# Patient Record
Sex: Male | Born: 1975 | Hispanic: Yes | Smoking: Never smoker
Health system: Southern US, Community
[De-identification: ages and names within clinical notes are randomized; demographics above are authoritative.]

---

## 2019-12-29 ENCOUNTER — Ambulatory Visit (INDEPENDENT_AMBULATORY_CARE_PROVIDER_SITE_OTHER): Payer: Self-pay

## 2019-12-29 ENCOUNTER — Other Ambulatory Visit: Payer: Self-pay

## 2019-12-29 ENCOUNTER — Ambulatory Visit
Admission: EM | Admit: 2019-12-29 | Discharge: 2019-12-29 | Disposition: A | Discharge status: Home / Self Care | Payer: Self-pay | Attending: Physician Assistant | Admitting: Physician Assistant

## 2019-12-29 DIAGNOSIS — M79644 Pain in right finger(s): Secondary | ICD-10-CM

## 2019-12-29 DIAGNOSIS — S61220A Laceration with foreign body of right index finger without damage to nail, initial encounter: Secondary | ICD-10-CM

## 2019-12-29 DIAGNOSIS — M7989 Other specified soft tissue disorders: Secondary | ICD-10-CM

## 2019-12-29 DIAGNOSIS — Z23 Encounter for immunization: Secondary | ICD-10-CM

## 2019-12-29 DIAGNOSIS — S61200A Unspecified open wound of right index finger without damage to nail, initial encounter: Secondary | ICD-10-CM

## 2019-12-29 MED ORDER — CEPHALEXIN 500 MG PO CAPS: 500.0000 mg | ORAL_CAPSULE | Freq: Four times a day (QID) | ORAL | 0 refills | Status: AC

## 2019-12-29 MED ORDER — TETANUS-DIPHTH-ACELL PERTUSSIS 5-2.5-18.5 LF-MCG/0.5 IM SUSP
0.5000 mL | Freq: Once | INTRAMUSCULAR | Status: AC
  Administered 2019-12-29: 0.5 mL via INTRAMUSCULAR

## 2019-12-29 MED ORDER — DICLOFENAC SODIUM 50 MG PO TBEC: 50.0000 mg | DELAYED_RELEASE_TABLET | Freq: Two times a day (BID) | ORAL | 0 refills | Status: AC

## 2019-12-29 NOTE — ED Triage Notes (Signed)
Pt sustained a laceration of the right pointer finger in multiple places on Friday of last week. Pt states it is not getting better and he was advised by one of the supervisors to be seen. Pt is aox4 and ambulatory.

## 2019-12-29 NOTE — Discharge Instructions (Addendum)
Xray negative for fracture. Tetanus updated. Start keflex for infection. Follow up with specialist for further management.

## 2019-12-29 NOTE — ED Provider Notes (Signed)
[facility]    CSN: [id number] Arrival date & time: 12/29/19  1623      History   Chief Complaint Chief Complaint  Patient presents with  . Laceration    occurred thursday    HPI Willie Cox is a 44 y.o. male.   44 year old male comes in for right 2nd finger injury that occurred 5-6 days ago. HPI obtained by patient through video translator. Patient was at work, using machine to cut wood, and somehow got hit by the disc that cuts the wood, causing multiple lacerations. Cleaned wound and apply a powder medicine on top of wound.  States dressed the wound daily. Unknown last tetanus.      History reviewed. No pertinent past medical history.  There are no problems to display for this patient.   History reviewed. No pertinent surgical history.     Home Medications    Prior to Admission medications   Medication Sig Start Date End Date Taking? Authorizing Provider  cephALEXin (KEFLEX) 500 MG capsule Take 1 capsule (500 mg total) by mouth 4 (four) times daily. 12/29/19   Cathie Hoops, Betheny Suchecki V, PA-C  diclofenac (VOLTAREN) 50 MG EC tablet Take 1 tablet (50 mg total) by mouth 2 (two) times daily. 12/29/19   Belinda Fisher, PA-C    Family History History reviewed. No pertinent family history.  Social History Social History   Tobacco Use  . Smoking status: Never Smoker  . Smokeless tobacco: Never Used  Vaping Use  . Vaping Use: Never used  Substance Use Topics  . Alcohol use: Never  . Drug use: Never     Allergies   Patient has no known allergies.   Review of Systems Review of Systems  Reason unable to perform ROS: See HPI as above.     Physical Exam Triage Vital Signs ED Triage Vitals  Enc Vitals Group     BP 12/29/19 1820 (!) 158/100     Pulse Rate 12/29/19 1820 65     Resp 12/29/19 1820 18     Temp 12/29/19 1820 97.6 F (36.4 C)     Temp Source 12/29/19 1820 Oral     SpO2 12/29/19 1820 97 %     Weight --      Height --      Head Circumference --       Peak Flow --      Pain Score 12/29/19 1838 6     Pain Loc --      Pain Edu? --      Excl. in GC? --    No data found.  Updated Vital Signs BP (!) 158/100 (BP Location: Left Arm)   Pulse 65   Temp 97.6 F (36.4 C) (Oral)   Resp 18   SpO2 97%   Physical Exam Constitutional:      General: He is not in acute distress.    Appearance: Normal appearance. He is well-developed. He is not toxic-appearing or diaphoretic.  HENT:     Head: Normocephalic and atraumatic.  Eyes:     Conjunctiva/sclera: Conjunctivae normal.     Pupils: Pupils are equal, round, and reactive to light.  Pulmonary:     Effort: Pulmonary effort is normal. No respiratory distress.  Musculoskeletal:     Cervical back: Normal range of motion and neck supple.     Comments: See picture below. Crusting of powder substance applied by patient. Wound not clearly visualized due to coating, but involves nailbed. Diffuse tenderness  to palpation. States no ROM and declined attempting. NVI  Skin:    General: Skin is warm and dry.  Neurological:     Mental Status: He is alert and oriented to person, place, and time.        UC Treatments / Results  Labs (all labs ordered are listed, but only abnormal results are displayed) Labs Reviewed - No data to display  EKG   Radiology DG Finger Index Right  Result Date: 12/29/2019 CLINICAL DATA:  Finger laceration with splinter would 6 days ago. Continued pain and swelling. EXAM: RIGHT INDEX FINGER 2+V COMPARISON:  None. FINDINGS: The mineralization and alignment are normal. There is no evidence of acute fracture or dislocation. Mild interphalangeal degenerative changes. The soft tissues appear swollen. No obvious foreign body or soft tissue emphysema. No evidence of bone destruction. IMPRESSION: Soft tissue swelling without evidence of foreign body or acute osseous findings. Electronically Signed   By: Carey Bullocks M.D.   On: 12/29/2019 19:25    Procedures Nerve  Block  Date/Time: 12/29/2019 8:55 PM Performed by: Belinda Fisher, PA-C Authorized by: Belinda Fisher, PA-C   Consent:    Consent obtained:  Verbal   Consent given by:  Patient   Risks discussed:  Allergic reaction, infection, intravenous injection, nerve damage, pain, swelling, unsuccessful block and bleeding   Alternatives discussed:  Alternative treatment Indications:    Indications:  Procedural anesthesia Location:    Body area:  Upper extremity   Upper extremity nerve blocked: finger.   Laterality:  Right Pre-procedure details:    Skin preparation:  Alcohol Procedure details (see MAR for exact dosages):    Block needle gauge:  18 G   Anesthetic injected:  Lidocaine 1% w/o epi   Injection procedure:  Anatomic landmarks identified, incremental injection, negative aspiration for blood, anatomic landmarks palpated and introduced needle Post-procedure details:    Outcome:  Pain improved   Patient tolerance of procedure:  Tolerated well, no immediate complications   (including critical care time)  Medications Ordered in UC Medications  Tdap (BOOSTRIX) injection 0.5 mL (0.5 mLs Intramuscular Given 12/29/19 2033)    Initial Impression / Assessment and Plan / UC Course  I have reviewed the triage vital signs and the nursing notes.  Pertinent labs & imaging results that were available during my care of the patient were reviewed by me and considered in my medical decision making (see chart for details).    X-ray negative for fracture or dislocation.  Wound was soaked after digital block, attempted removal of coating on wound without success.  Given almost 5-day history of symptoms, will place patient on antibiotics and follow-up with hand Ortho for further evaluation.  Tetanus updated.  Wound care instructions given.  Return precautions given.    Final Clinical Impressions(s) / UC Diagnoses   Final diagnoses:  Open wound of right index finger  Finger pain, right    ED Prescriptions      Medication Sig Dispense Auth. Provider   diclofenac (VOLTAREN) 50 MG EC tablet Take 1 tablet (50 mg total) by mouth 2 (two) times daily. 20 tablet Noni Stonesifer V, PA-C   cephALEXin (KEFLEX) 500 MG capsule Take 1 capsule (500 mg total) by mouth 4 (four) times daily. 28 capsule Belinda Fisher, PA-C     PDMP not reviewed this encounter.   Belinda Fisher, PA-C 12/29/19 2057

## 2020-08-11 ENCOUNTER — Other Ambulatory Visit: Payer: Self-pay

## 2020-08-11 ENCOUNTER — Emergency Department (HOSPITAL_COMMUNITY): Payer: Self-pay

## 2020-08-11 ENCOUNTER — Emergency Department (HOSPITAL_COMMUNITY)
Admission: EM | Admit: 2020-08-11 | Discharge: 2020-08-11 | Disposition: A | Discharge status: Home / Self Care | Payer: Self-pay | Attending: Emergency Medicine | Admitting: Emergency Medicine

## 2020-08-11 ENCOUNTER — Encounter (HOSPITAL_COMMUNITY): Payer: Self-pay | Admitting: Pharmacy Technician

## 2020-08-11 DIAGNOSIS — M25551 Pain in right hip: Secondary | ICD-10-CM | POA: Insufficient documentation

## 2020-08-11 DIAGNOSIS — M25561 Pain in right knee: Secondary | ICD-10-CM | POA: Insufficient documentation

## 2020-08-11 DIAGNOSIS — R519 Headache, unspecified: Secondary | ICD-10-CM | POA: Insufficient documentation

## 2020-08-11 DIAGNOSIS — M62830 Muscle spasm of back: Secondary | ICD-10-CM | POA: Insufficient documentation

## 2020-08-11 DIAGNOSIS — W11XXXA Fall on and from ladder, initial encounter: Secondary | ICD-10-CM | POA: Insufficient documentation

## 2020-08-11 DIAGNOSIS — M79651 Pain in right thigh: Secondary | ICD-10-CM | POA: Insufficient documentation

## 2020-08-11 DIAGNOSIS — W19XXXA Unspecified fall, initial encounter: Secondary | ICD-10-CM

## 2020-08-11 DIAGNOSIS — M542 Cervicalgia: Secondary | ICD-10-CM | POA: Insufficient documentation

## 2020-08-11 DIAGNOSIS — R109 Unspecified abdominal pain: Secondary | ICD-10-CM | POA: Insufficient documentation

## 2020-08-11 LAB — CBC WITH DIFFERENTIAL/PLATELET
Abs Immature Granulocytes: 0.03 10*3/uL (ref 0.00–0.07)
Basophils Absolute: 0.1 10*3/uL (ref 0.0–0.1)
Basophils Relative: 1 %
Eosinophils Absolute: 1.3 10*3/uL — ABNORMAL HIGH (ref 0.0–0.5)
Eosinophils Relative: 12 %
HCT: 41.6 % (ref 39.0–52.0)
Hemoglobin: 14.3 g/dL (ref 13.0–17.0)
Immature Granulocytes: 0 %
Lymphocytes Relative: 24 %
Lymphs Abs: 2.7 10*3/uL (ref 0.7–4.0)
MCH: 30.4 pg (ref 26.0–34.0)
MCHC: 34.4 g/dL (ref 30.0–36.0)
MCV: 88.5 fL (ref 80.0–100.0)
Monocytes Absolute: 0.8 10*3/uL (ref 0.1–1.0)
Monocytes Relative: 7 %
Neutro Abs: 6.3 10*3/uL (ref 1.7–7.7)
Neutrophils Relative %: 56 %
Platelets: 270 10*3/uL (ref 150–400)
RBC: 4.7 MIL/uL (ref 4.22–5.81)
RDW: 13 % (ref 11.5–15.5)
WBC: 11.2 10*3/uL — ABNORMAL HIGH (ref 4.0–10.5)
nRBC: 0 % (ref 0.0–0.2)

## 2020-08-11 LAB — COMPREHENSIVE METABOLIC PANEL
ALT: 38 U/L (ref 0–44)
AST: 46 U/L — ABNORMAL HIGH (ref 15–41)
Albumin: 3.7 g/dL (ref 3.5–5.0)
Alkaline Phosphatase: 70 U/L (ref 38–126)
Anion gap: 5 (ref 5–15)
BUN: 12 mg/dL (ref 6–20)
CO2: 25 mmol/L (ref 22–32)
Calcium: 8.6 mg/dL — ABNORMAL LOW (ref 8.9–10.3)
Chloride: 108 mmol/L (ref 98–111)
Creatinine, Ser: 0.55 mg/dL — ABNORMAL LOW (ref 0.61–1.24)
GFR, Estimated: >60 mL/min (ref >60)
Glucose, Bld: 96 mg/dL (ref 70–99)
Potassium: 3.7 mmol/L (ref 3.5–5.1)
Sodium: 138 mmol/L (ref 135–145)
Total Bilirubin: 1.3 mg/dL — ABNORMAL HIGH (ref 0.3–1.2)
Total Protein: 6.7 g/dL (ref 6.5–8.1)

## 2020-08-11 MED ORDER — IOHEXOL 300 MG/ML  SOLN
75.0000 mL | Freq: Once | INTRAMUSCULAR | Status: AC | PRN
  Administered 2020-08-11: 75 mL via INTRAVENOUS

## 2020-08-11 MED ORDER — OXYCODONE-ACETAMINOPHEN 5-325 MG PO TABS: 1.0000 | ORAL_TABLET | Freq: Four times a day (QID) | ORAL | 0 refills | Status: AC | PRN

## 2020-08-11 MED ORDER — OXYCODONE-ACETAMINOPHEN 5-325 MG PO TABS
2.0000 | ORAL_TABLET | Freq: Once | ORAL | Status: AC
Start: 2020-08-11 — End: 2020-08-11
  Administered 2020-08-11: 2 via ORAL
  Filled 2020-08-11: qty 2

## 2020-08-11 MED ORDER — METHOCARBAMOL 750 MG PO TABS: 750.0000 mg | ORAL_TABLET | Freq: Four times a day (QID) | ORAL | 0 refills | Status: AC

## 2020-08-11 NOTE — ED Notes (Signed)
Per triage - "Pt here with reports of falling approx 30 feet off of a ladder yesterday, landing on his R side and abdomen. Pt endorses +LOC yesterday. Pt with pain to bil legs, abdomen, r temporal and R forearm. CCollar placed in triage."

## 2020-08-11 NOTE — ED Notes (Signed)
Patient transported to X-ray via transpo svc

## 2020-08-11 NOTE — ED Notes (Signed)
ED Provider at bedside. Dr. Freida Busman in with pt using the translating svc

## 2020-08-11 NOTE — ED Triage Notes (Signed)
Pt here with reports of falling approx 30 feet off of a ladder yesterday, landing on his R side and abdomen. Pt endorses +LOC yesterday. Pt with pain to bil legs, abdomen, r temporal and R forearm. CCollar placed in triage.

## 2020-08-11 NOTE — ED Notes (Signed)
Pt put in C collar

## 2020-08-11 NOTE — ED Provider Notes (Signed)
Emergency Medicine Provider Triage Evaluation Note  Willie Cox , a 45 y.o. male  was evaluated in triage.  Pt complains of fall from 3 foot ladder yesterday.  States that he was working on his house when he lost his balance.  States that he fell and hit his chest area on a balcony and then fell 15 feet more onto the ground.  States that he hit his head and did not lose consciousness.  Since then he has been having right-sided pelvis pain, right sided abdominal pain, right lower chest pain, right knee pain.  Denies any anticoagulant use.  Review of Systems  Positive: Abdominal pain, chest pain, loss of consciousness Negative: Vision changes, numbness  Physical Exam  BP (!) 140/97 (BP Location: Left Arm)   Pulse 70   Temp 98.2 F (36.8 C) (Oral)   Resp 16   SpO2 97%  Gen:   Awake, no distress Resp:  Normal MSK:   Moves extremities without difficulty Other:  Tenderness palpation of the right lower quadrant.  Tenderness palpation of bilateral chest wall.  No facial asymmetry.  Moving all extremities without difficulty.  Medical Decision Making  Medically screening exam initiated at 12:35 PM.  Appropriate orders placed.  Maurine Minister Arcidesvelasquel was informed that the remainder of the evaluation will be completed by another provider, this initial triage assessment does not replace that evaluation, and the importance of remaining in the ED until their evaluation is complete.  Will order imaging and lab work   Dietrich Pates, Cordelia Poche 08/11/20 1237    Margarita Grizzle, MD 08/14/20 1340

## 2020-08-11 NOTE — ED Provider Notes (Signed)
[facility] EMERGENCY DEPARTMENT Provider Note   CSN: [id number] Arrival date & time: 08/11/20  1152     History Chief Complaint  Patient presents with  . Fall    Willie Cox is a 45 y.o. male.  45 year old male presents after falling 30 feet from a ladder yesterday.  Denies any loss of consciousness.  Complains of pain to his right hip, right flank, right thigh.  Slight right knee pain as well 2.  He is able to ambulate.  Has had some midline neck pain characterizes sharp.  Denies any weakness in his arms or legs.  Denies any hematuria or dysuria denies any chest or abdominal discomfort at this time.  Has had a mild headache but no visual changes or nausea or emesis.  Has used Motrin prior to arrival with limited relief        History reviewed. No pertinent past medical history.  There are no problems to display for this patient.   History reviewed. No pertinent surgical history.     No family history on file.     Home Medications Prior to Admission medications   Not on File    Allergies    Patient has no known allergies.  Review of Systems   Review of Systems  All other systems reviewed and are negative.   Physical Exam Updated Vital Signs BP (!) 140/97 (BP Location: Left Arm)   Pulse 70   Temp 98.2 F (36.8 C) (Oral)   Resp 16   SpO2 97%   Physical Exam Vitals and nursing note reviewed.  Constitutional:      General: He is not in acute distress.    Appearance: Normal appearance. He is well-developed. He is not toxic-appearing.  HENT:     Head: Normocephalic and atraumatic.  Eyes:     General: Lids are normal.     Conjunctiva/sclera: Conjunctivae normal.     Pupils: Pupils are equal, round, and reactive to light.  Neck:     Thyroid: No thyroid mass.     Trachea: No tracheal deviation.  Cardiovascular:     Rate and Rhythm: Normal rate and regular rhythm.     Heart sounds: Normal heart sounds. No murmur heard. No  gallop.   Pulmonary:     Effort: Pulmonary effort is normal. No respiratory distress.     Breath sounds: Normal breath sounds. No stridor. No decreased breath sounds, wheezing, rhonchi or rales.  Abdominal:     General: Bowel sounds are normal. There is no distension.     Palpations: Abdomen is soft.     Tenderness: There is no abdominal tenderness. There is no rebound.  Musculoskeletal:        General: Normal range of motion.     Cervical back: Normal range of motion and neck supple. Bony tenderness present. Pain with movement present. Normal range of motion.     Lumbar back: Spasms and tenderness present. No bony tenderness.       Back:       Legs:     Comments: No shortening or rotation to patient's lower extremity.  Neurovascular tact at right foot.  Pelvis stable  Skin:    General: Skin is warm and dry.     Findings: No abrasion or rash.  Neurological:     General: No focal deficit present.     Mental Status: He is alert and oriented to person, place, and time.     GCS: GCS eye subscore is  4. GCS verbal subscore is 5. GCS motor subscore is 6.     Cranial Nerves: Cranial nerves are intact. No cranial nerve deficit.     Sensory: No sensory deficit.     Motor: Motor function is intact.  Psychiatric:        Speech: Speech normal.        Behavior: Behavior normal.     ED Results / Procedures / Treatments   Labs (all labs ordered are listed, but only abnormal results are displayed) Labs Reviewed  COMPREHENSIVE METABOLIC PANEL  CBC WITH DIFFERENTIAL/PLATELET    EKG None  Radiology No results found.  Procedures Procedures   Medications Ordered in ED Medications  oxyCODONE-acetaminophen (PERCOCET/ROXICET) 5-325 MG per tablet 2 tablet (has no administration in time range)    ED Course  I have reviewed the triage vital signs and the nursing notes.  Pertinent labs & imaging results that were available during my care of the patient were reviewed by me and considered  in my medical decision making (see chart for details).    MDM Rules/Calculators/A&P                          She medicated for pain with Percocet here.  X-rays are negative.  Will prescribe analgesics and discharge Final Clinical Impression(s) / ED Diagnoses Final diagnoses:  None    Rx / DC Orders ED Discharge Orders    None       Lorre Nick, MD 08/11/20 1525

## 2020-08-14 ENCOUNTER — Other Ambulatory Visit: Payer: Self-pay

## 2020-08-14 ENCOUNTER — Emergency Department (HOSPITAL_COMMUNITY)
Admission: EM | Admit: 2020-08-14 | Discharge: 2020-08-14 | Disposition: A | Discharge status: Home / Self Care | Payer: Self-pay | Attending: Emergency Medicine | Admitting: Emergency Medicine

## 2020-08-14 ENCOUNTER — Emergency Department (HOSPITAL_COMMUNITY): Payer: Self-pay

## 2020-08-14 ENCOUNTER — Encounter (HOSPITAL_COMMUNITY): Payer: Self-pay | Admitting: *Deleted

## 2020-08-14 DIAGNOSIS — W11XXXA Fall on and from ladder, initial encounter: Secondary | ICD-10-CM | POA: Insufficient documentation

## 2020-08-14 DIAGNOSIS — M79604 Pain in right leg: Secondary | ICD-10-CM | POA: Insufficient documentation

## 2020-08-14 DIAGNOSIS — M545 Low back pain, unspecified: Secondary | ICD-10-CM | POA: Insufficient documentation

## 2020-08-14 DIAGNOSIS — Y990 Civilian activity done for income or pay: Secondary | ICD-10-CM | POA: Insufficient documentation

## 2020-08-14 DIAGNOSIS — M25551 Pain in right hip: Secondary | ICD-10-CM | POA: Insufficient documentation

## 2020-08-14 MED ORDER — IBUPROFEN 600 MG PO TABS: 600.0000 mg | ORAL_TABLET | Freq: Four times a day (QID) | ORAL | 0 refills | Status: AC | PRN

## 2020-08-14 MED ORDER — IBUPROFEN 400 MG PO TABS
600.0000 mg | ORAL_TABLET | Freq: Once | ORAL | Status: AC
  Administered 2020-08-14: 600 mg via ORAL
  Filled 2020-08-14: qty 1

## 2020-08-14 MED ORDER — ACETAMINOPHEN 500 MG PO TABS: 500.0000 mg | ORAL_TABLET | Freq: Four times a day (QID) | ORAL | 0 refills | Status: AC | PRN

## 2020-08-14 MED ORDER — ACETAMINOPHEN 500 MG PO TABS
1000.0000 mg | ORAL_TABLET | Freq: Once | ORAL | Status: AC
  Administered 2020-08-14: 1000 mg via ORAL
  Filled 2020-08-14: qty 2

## 2020-08-14 NOTE — Social Work (Signed)
CSW added [facility] to AVS in both [place] and [place].

## 2020-08-14 NOTE — ED Triage Notes (Signed)
Interpretor used. Pt had a fall 3 days ago, was seen here following the fall. Reports now has right side lower back pain and no relief with meds given.

## 2020-08-14 NOTE — ED Provider Notes (Addendum)
[facility] EMERGENCY DEPARTMENT Provider Note   CSN: [id number] Arrival date & time: 08/14/20  1451     History Chief Complaint  Patient presents with  . Back Pain    Willie Cox is a 45 y.o. male presents to the ED for evaluation of continued pain on the outside part of his right thigh that radiates to his right buttocks and right low back.  Patient had a fall while at work 3 days ago.  He fell off of a 30 foot ladder.  He was seen in the emergency department and had CT scans.  He was told that everything was normal.  He was discharged with Flexeril and oxycodone which he has been taking.  States the pain in his neck, back, right arm and left knee have resolved but his pain in his right low back, right hip, right leg and buttock continues and is unrelieved by medicines.  Pain is worse with movement, palpation.  Denies any repeat injury or falls.  No extremity weakness, tingling, numbness, groin numbness or difficulty controlling his bladder or bowels.  No fever.  No urinary symptoms.  No abdominal pain.  Patient states his boss is asking him to return to work but he feels like he needs more time to rest.  He is asking for a letter for work.  HPI     History reviewed. No pertinent past medical history.  There are no problems to display for this patient.   History reviewed. No pertinent surgical history.     History reviewed. No pertinent family history.  Social History   Tobacco Use  . Smoking status: Never Smoker  . Smokeless tobacco: Never Used  Vaping Use  . Vaping Use: Never used  Substance Use Topics  . Alcohol use: Never  . Drug use: Never    Home Medications Prior to Admission medications   Medication Sig Start Date End Date Taking? Authorizing Provider  acetaminophen (TYLENOL) 500 MG tablet Take 1 tablet (500 mg total) by mouth every 6 (six) hours as needed. 08/14/20  Yes Liberty Handy, PA-C  ibuprofen (ADVIL) 600 MG  tablet Take 1 tablet (600 mg total) by mouth every 6 (six) hours as needed. 08/14/20  Yes Liberty Handy, PA-C  cephALEXin (KEFLEX) 500 MG capsule Take 1 capsule (500 mg total) by mouth 4 (four) times daily. 12/29/19   Cathie Hoops, Amy V, PA-C  diclofenac (VOLTAREN) 50 MG EC tablet Take 1 tablet (50 mg total) by mouth 2 (two) times daily. 12/29/19   Belinda Fisher, PA-C  methocarbamol (ROBAXIN-750) 750 MG tablet Take 1 tablet (750 mg total) by mouth 4 (four) times daily. 08/11/20   Lorre Nick, MD  oxyCODONE-acetaminophen (PERCOCET/ROXICET) 5-325 MG tablet Take 1-2 tablets by mouth every 6 (six) hours as needed for severe pain. 08/11/20   Lorre Nick, MD    Allergies    Patient has no known allergies.  Review of Systems   Review of Systems  Musculoskeletal: Positive for arthralgias, gait problem and myalgias.  All other systems reviewed and are negative.   Physical Exam Updated Vital Signs BP (!) 148/90   Pulse 66   Temp 98.1 F (36.7 C) (Oral)   Resp 17   SpO2 98%   Physical Exam Vitals and nursing note reviewed.  Constitutional:      General: He is not in acute distress.    Appearance: He is well-developed.     Comments: NAD.  HENT:  Head: Normocephalic and atraumatic.     Right Ear: External ear normal.     Left Ear: External ear normal.     Nose: Nose normal.  Eyes:     General: No scleral icterus.    Conjunctiva/sclera: Conjunctivae normal.  Cardiovascular:     Rate and Rhythm: Normal rate and regular rhythm.     Heart sounds: Normal heart sounds. No murmur heard.     Comments: 1+ Dp pulses bilaterally. No LE edema  Pulmonary:     Effort: Pulmonary effort is normal.     Breath sounds: Normal breath sounds. No wheezing.  Abdominal:     Palpations: Abdomen is soft.     Tenderness: There is no abdominal tenderness.     Comments: No suprapubic or CVA tenderness. No abdominal ecchymosis   Musculoskeletal:        General: No deformity. Normal range of motion.     Cervical  back: Normal range of motion and neck supple.     Comments: T-spine: no midline or paraspinal tenderness.   L-spine: mild diffuse right sided low lumbar/upper buttock tenderness. No instability of pelvis with A/L compression. No leg shortening or malrotation. Pain when sitting up on bed.   RLE: diffuse mild tenderness of right lateral thigh, right buttock and right greater trochanter. Full ROM of right hip with some pain. No leg shortening or malrotation. No crepitus with right leg movement. +SLR (right).   Skin:    General: Skin is warm and dry.     Capillary Refill: Capillary refill takes less than 2 seconds.  Neurological:     Mental Status: He is alert and oriented to person, place, and time.     Comments: Strength and sensation to light touch intact in LEs  Psychiatric:        Behavior: Behavior normal.        Thought Content: Thought content normal.        Judgment: Judgment normal.     ED Results / Procedures / Treatments   Labs (all labs ordered are listed, but only abnormal results are displayed) Labs Reviewed - No data to display  EKG None  Radiology CT Lumbar Spine Wo Contrast  Result Date: 08/14/2020 CLINICAL DATA:  Low back pain, trauma EXAM: CT LUMBAR SPINE WITHOUT CONTRAST TECHNIQUE: Multidetector CT imaging of the lumbar spine was performed without intravenous contrast administration. Multiplanar CT image reconstructions were also generated. COMPARISON:  None. FINDINGS: Segmentation: 5 lumbar type vertebrae. Alignment: Straightening of lumbar lordosis. Anteroposterior alignment is maintained. Vertebrae: Vertebral body heights are preserved apart from a Schmorl's node at the L1 superior endplate. No acute fracture. Small bone island of left iliac bone. Paraspinal and other soft tissues: Unremarkable. Disc levels: Intervertebral disc heights are maintained. No canal stenosis. Minimal left foraminal stenosis at L5-S1. IMPRESSION: No acute cervical spine fracture.  Electronically Signed   By: Guadlupe Spanish M.D.   On: 08/14/2020 16:21    Procedures Procedures   Medications Ordered in ED Medications  acetaminophen (TYLENOL) tablet 1,000 mg (1,000 mg Oral Given 08/14/20 1943)  ibuprofen (ADVIL) tablet 600 mg (600 mg Oral Given 08/14/20 1943)    ED Course  I have reviewed the triage vital signs and the nursing notes.  Pertinent labs & imaging results that were available during my care of the patient were reviewed by me and considered in my medical decision making (see chart for details).    MDM Rules/Calculators/A&P  45 year old male presents to the ED for ongoing right low back pain radiating to right thigh after fall off of a 30 foot ladder while at work 3 days ago.    EMR, triage nurse notes reviewed.  ED visit 3 days ago reviewed.  He had extensive CT imaging which was surprisingly negative for acute injuries.  He was discharged with oxycodone/acetaminophen and Robaxin.  Patient states he has been taking "Robaxin" for his pain.  He did not know that oxycodone was pain medicine.  He is requesting a work note so he can have a few more days of rest.  I think this is very reasonable.    2045: CT of lumbar spine was ordered by [facility] provider.  CT personally reviewed.  Per radiology report on CT lumbar impression states "cervical spine", likely typo.  I have reviewed images myself. Noted abnormality at L4 axial images series #4 image 90.  Attempted to call [place] radiology x 2 but unable to get a hold of radiologist.   2315: Finally able to speak to [facility] radiologist.  Spoke to Dr Rise Mu who has reviewed images and confirms acute oblique fracture at L4 with questionable edema.  Would be best for patient to get MRI to confirm/further evaluate.  I called patient and explained situation. Given that patient is uninsured, has no PCP to follow up, feel it will be best for him to return to ER for further imaging.  Patient states he  doesn't have a ride to come back to the ED tonight. He will try to get a ride in the morning.    *If patient returns, provider to order MRI lumbar without* for further evaluation*.  Discussed with EDP Messick.   Final Clinical Impression(s) / ED Diagnoses Final diagnoses:  Right leg pain  Work related injury    Rx / DC Orders ED Discharge Orders         Ordered    ibuprofen (ADVIL) 600 MG tablet  Every 6 hours PRN        08/14/20 2018    acetaminophen (TYLENOL) 500 MG tablet  Every 6 hours PRN        08/14/20 2018             Liberty Handy, PA-C 08/14/20 2045    Liberty Handy, PA-C 08/14/20 2317    Liberty Handy, PA-C 08/14/20 2344    Wynetta Fines, MD 08/16/20 1401

## 2020-08-14 NOTE — ED Provider Notes (Signed)
Emergency Medicine Provider Triage Evaluation Note  Willie Cox , a 45 y.o. male  was evaluated in triage.  Pt complains of R lower back pain radiating down the leg right leg.  Seen 2 days ago.  Occurred after he fell off a ladder.  Medications not working.  Return for reevaluation, no weakness numbness or red flag symptoms  Review of Systems  Positive: Back and leg pain Negative: Weakness  Physical Exam  There were no vitals taken for this visit. Gen:   Awake, no distress   Resp:  Normal effort  MSK:   Moves extremities without difficulty  Other:  ambulates  Medical Decision Making  Medically screening exam initiated at 3:24 PM.  Appropriate orders placed.  Willie Cox was informed that the remainder of the evaluation will be completed by another provider, this initial triage assessment does not replace that evaluation, and the importance of remaining in the ED until their evaluation is complete.  CT lumbar ordered.   Arthor Captain, PA-C 08/14/20 1546    Arby Barrette, MD 08/15/20 1029

## 2020-08-14 NOTE — Discharge Instructions (Addendum)
[facility]  [phone]  [address] [place] [address] [place] [address]   Prximos pasos: Programe una cita lo antes posible para una visita  Tome  oxycodone/acetaminophen cada 6 hours (1 o 2 pastillas) para dolor.  Cuando termine estas pastillas tome 600 mg ibuprofen y [phone] mg acetaminophen cada 6 horas.   Methocarbamol (flexeril) 750 mg es una pastilla para [employer]. Tome 1 pastilla solo en la noche, antes de dormir.   Leanna Sato cita con un doctor general en la clinica de [facility]   Regrese si tiene fiebre, el dolor empeora o siente hormigueo, entumecimiento, problemas al controlar su Willie Cox

## 2022-10-04 IMAGING — CT CT HEAD W/O CM
4 series · 16 of 47 positions shown, 18 images · non-contrast
Comparison: None

CLINICAL DATA: Fell 30 feet off a ladder yesterday landing on RIGHT
side, loss of consciousness, RIGHT temporal pain, neck trauma,
midline tenderness

EXAM:
CT HEAD WITHOUT CONTRAST
CT CERVICAL SPINE WITHOUT CONTRAST
TECHNIQUE: Multidetector CT imaging of the head and cervical spine was
performed following the standard protocol without intravenous
contrast. Multiplanar CT image reconstructions of the cervical spine
were also generated.

[Series 3: head without · axial · non-contrast · 0.44mm/px · z∈[-118,+2]mm · 7 of 34 slices shown, 9 images]
[im 5/34  brain]
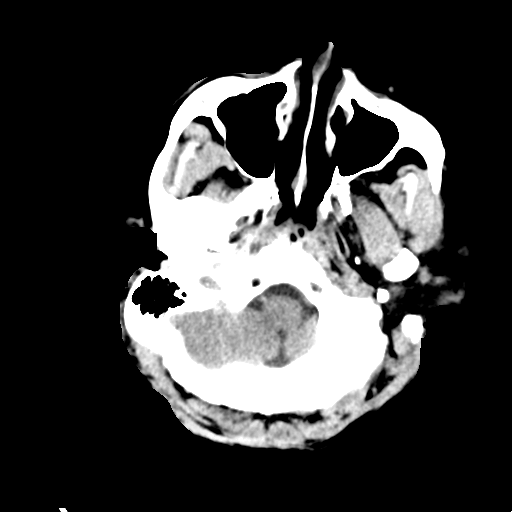
[im 5/34  bone]
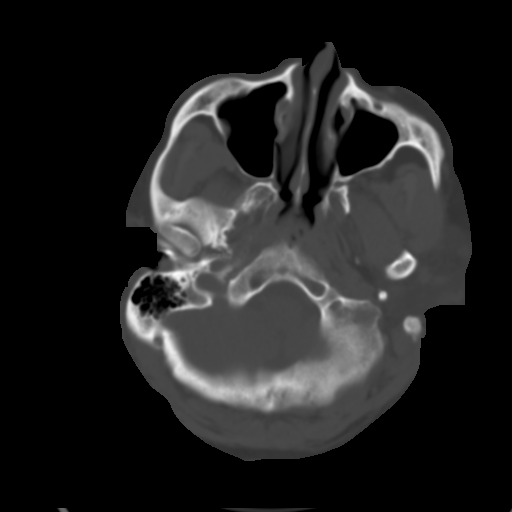
[im 9/34  brain]
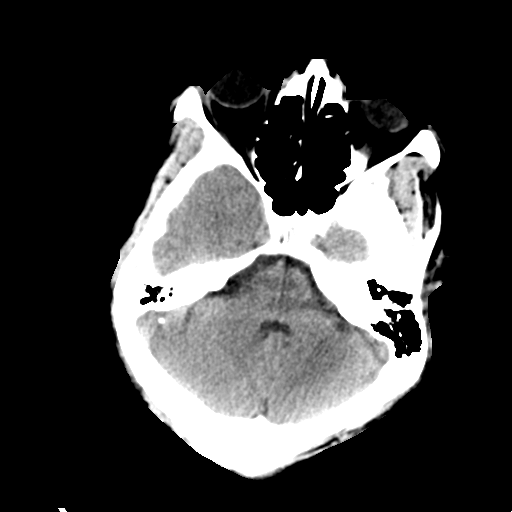
[im 13/34  brain]
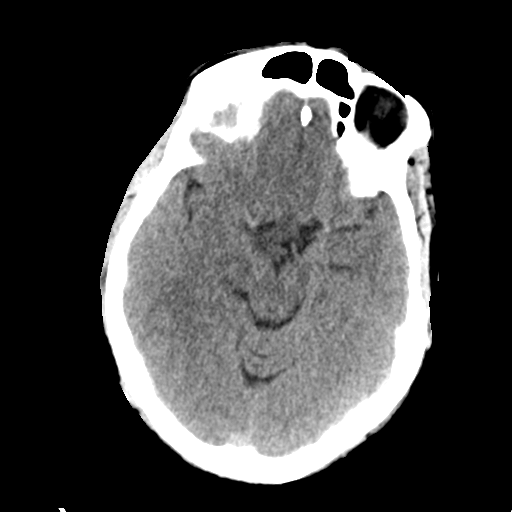
[im 17/34  brain]
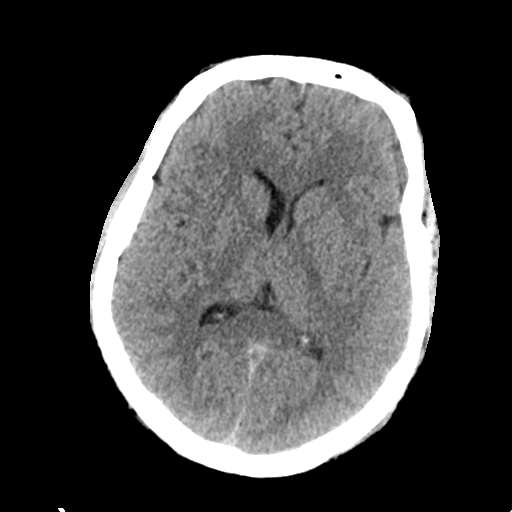
[im 21/34  brain]
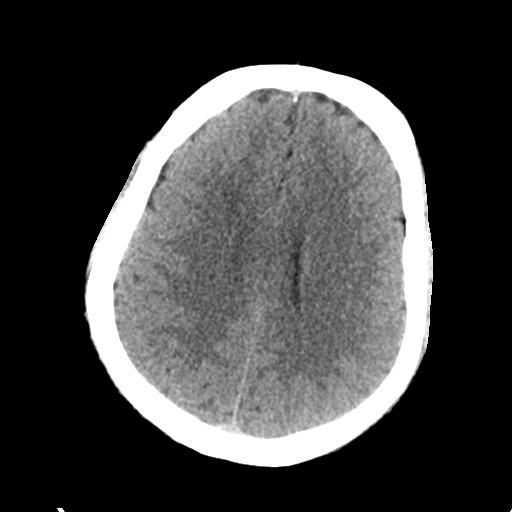
[im 21/34  bone]
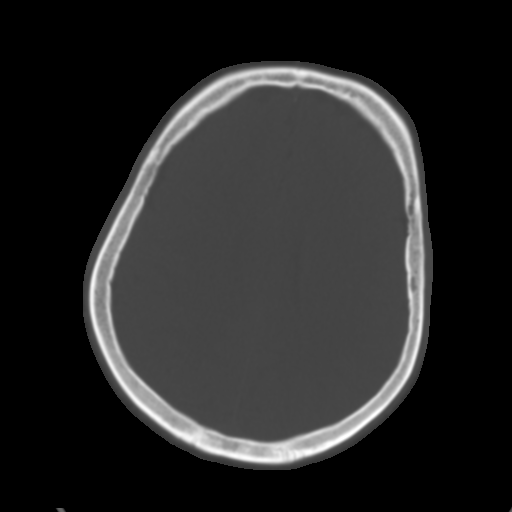
[im 25/34  brain]
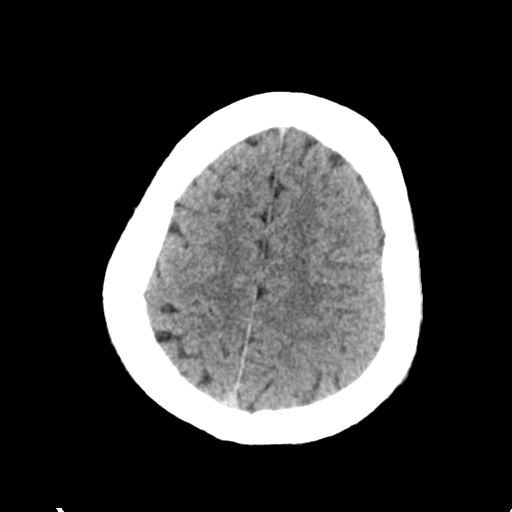
[im 29/34  brain]
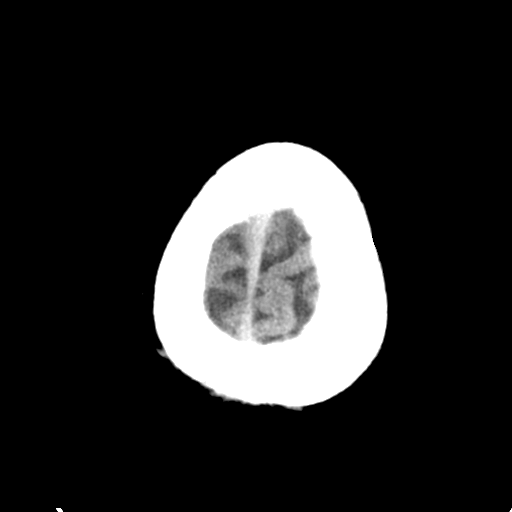

[Series 4: head bone · axial · 0.44mm/px · z∈[-122,-90]mm · 3 of 84 slices shown]
[im 9/84  bone]
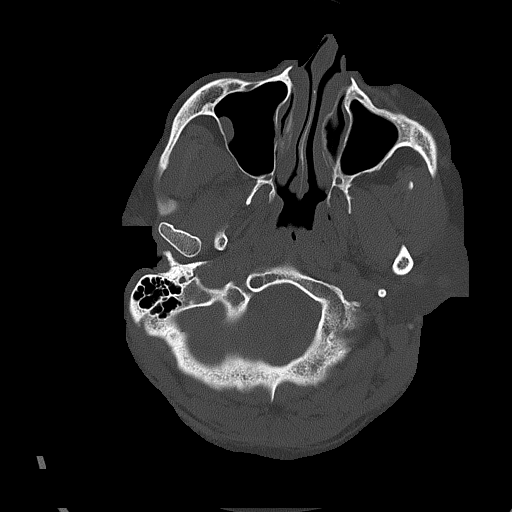
[im 17/84  bone]
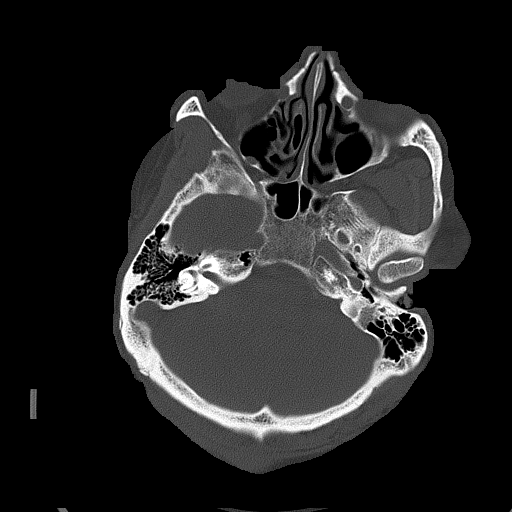
[im 25/84  bone]
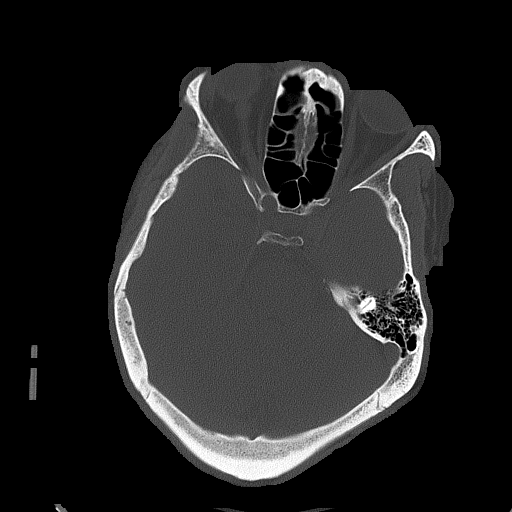

[Series 5: head without cor · coronal · non-contrast · 0.32mm/px · 3 of 75 slices shown]
[im 25/75  brain]
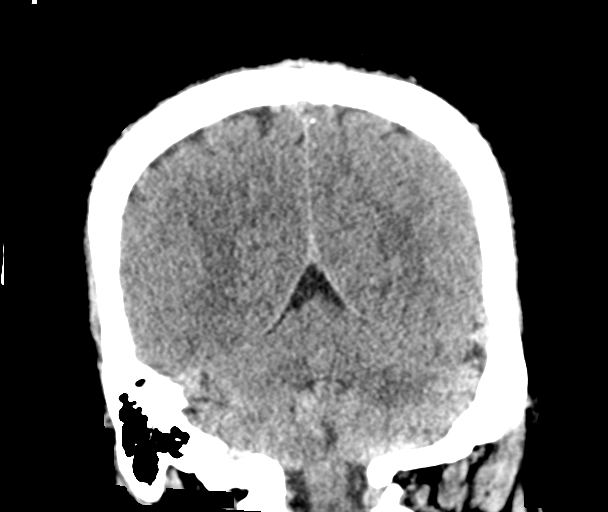
[im 33/75  brain]
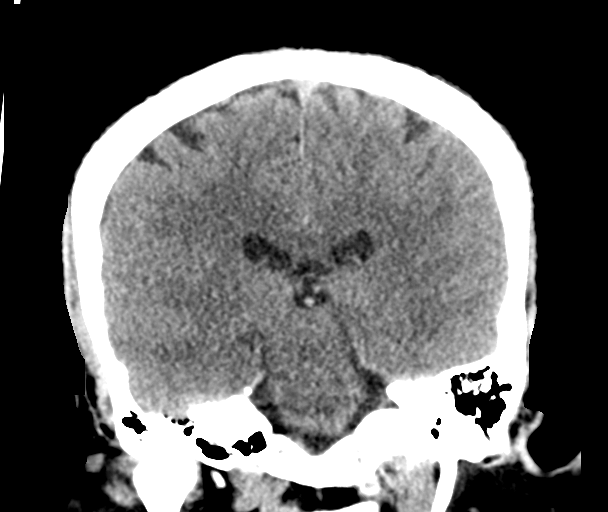
[im 42/75  brain]
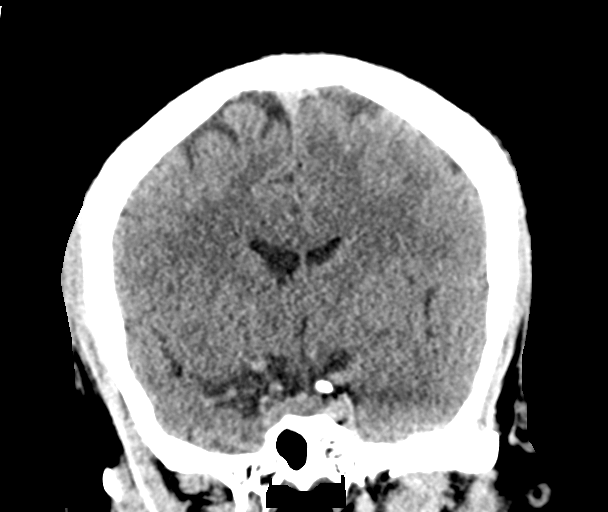

[Series 6: head without sag · sagittal · non-contrast · 0.32mm/px · 3 of 67 slices shown]
[im 26/67  brain]
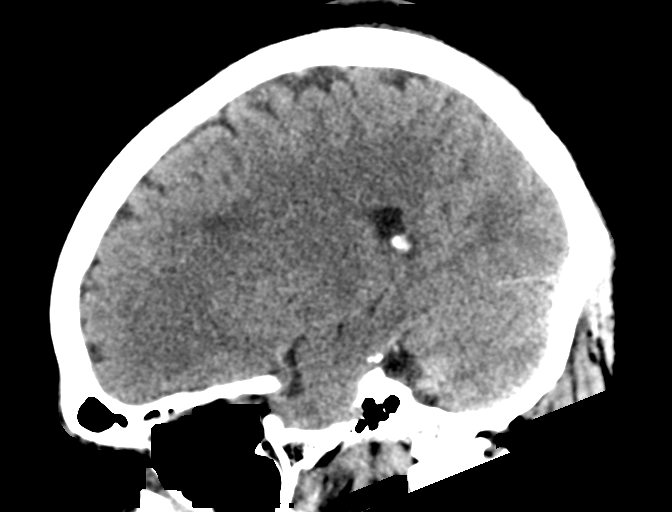
[im 34/67  brain]
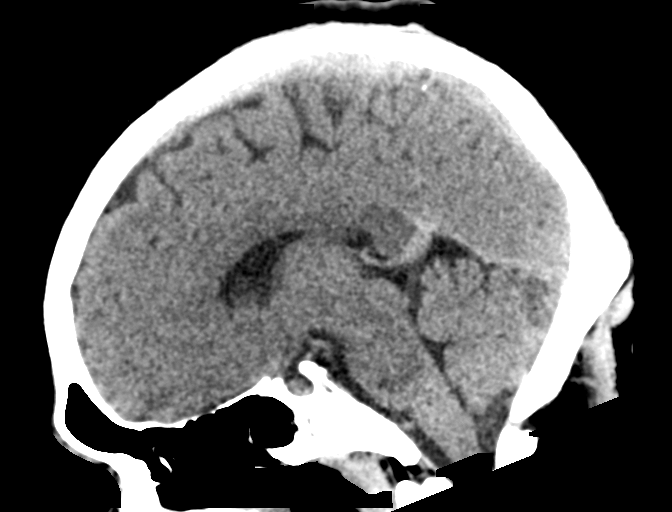
[im 41/67  brain]
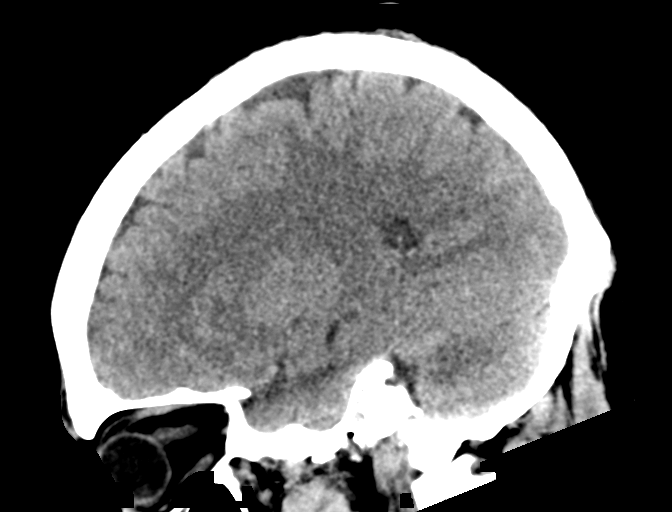

[16 of 47 positions shown; findings below may reference images not displayed]

FINDINGS: CT HEAD FINDINGS

Brain: Slight asymmetry of the lateral ventricles, RIGHT larger than
LEFT, without midline shift or mass effect. Otherwise normal
appearance of brain parenchyma. No intracranial hemorrhage, mass
lesion, or evidence of acute infarction. No extra-axial fluid
collections.

Vascular: No hyperdense vessels.

Skull: Intact

Sinuses/Orbits: Small mucosal retention cyst RIGHT maxillary sinus.
Probable cerumen within the external auditory canals.

Other: N/A

CT CERVICAL SPINE FINDINGS

Alignment: Normal

Skull base and vertebrae: Osseous mineralization normal. Skull base
intact. Vertebral body and disc space heights maintained. No
fracture, subluxation, or bone destruction.

Soft tissues and spinal canal: Prevertebral soft tissues normal
thickness. Remaining visualized soft tissues unremarkable.

Disc levels:  No specific abnormalities

Upper chest: Lung apices clear

Other: N/A
IMPRESSION: No acute intracranial abnormalities.

Normal CT cervical spine.

## 2022-10-04 IMAGING — CR DG FEMUR 2+V*R*
4 series · 4 of 4 positions shown · non-contrast
Comparison: None.

CLINICAL DATA: Fall approximately 30 feet from ladder yesterday,
pain

EXAM:
RIGHT FEMUR 2 VIEWS

[femur ap (1 of 2)]
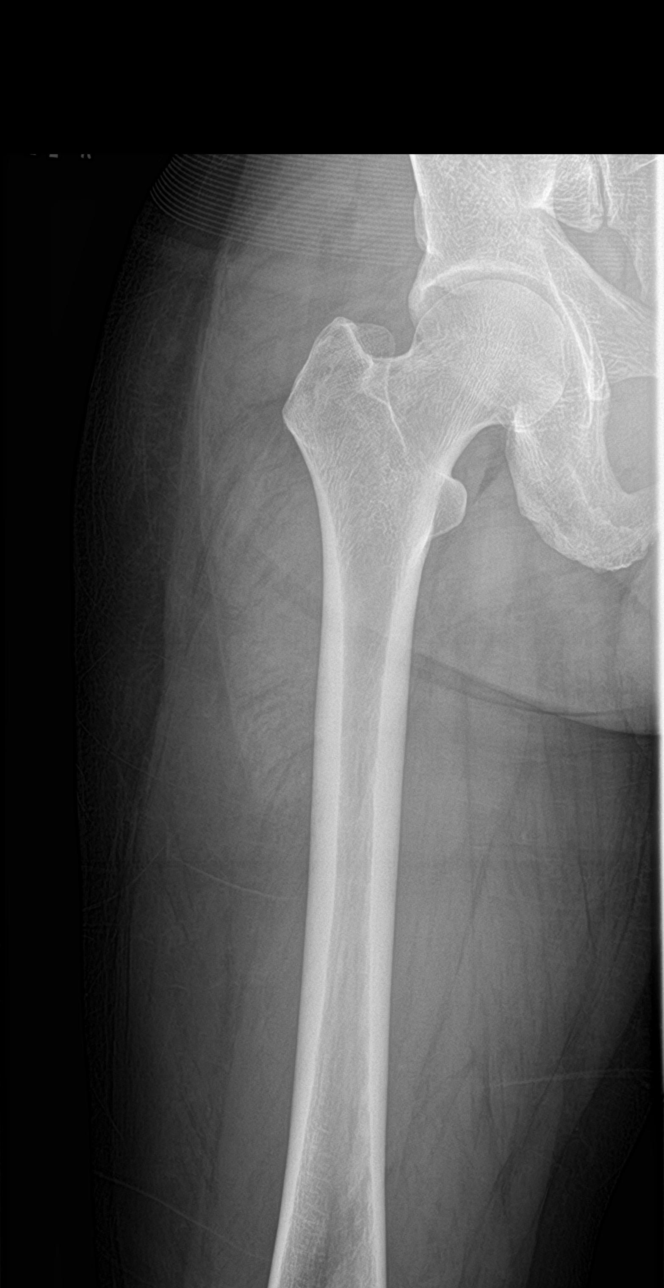

[femur lat (1 of 2)]
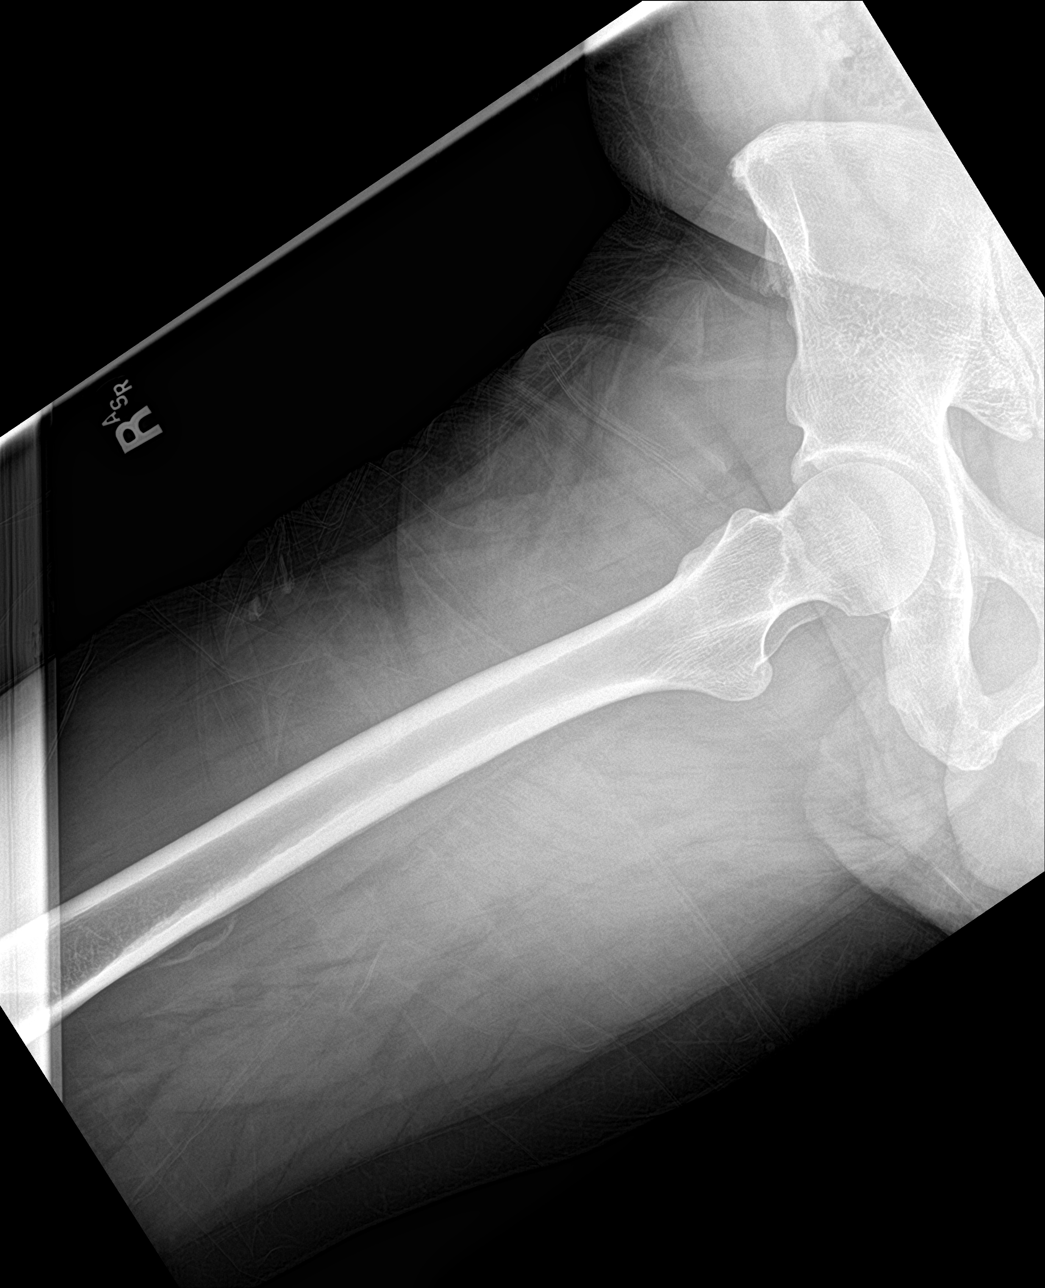

[femur lat (2 of 2)]
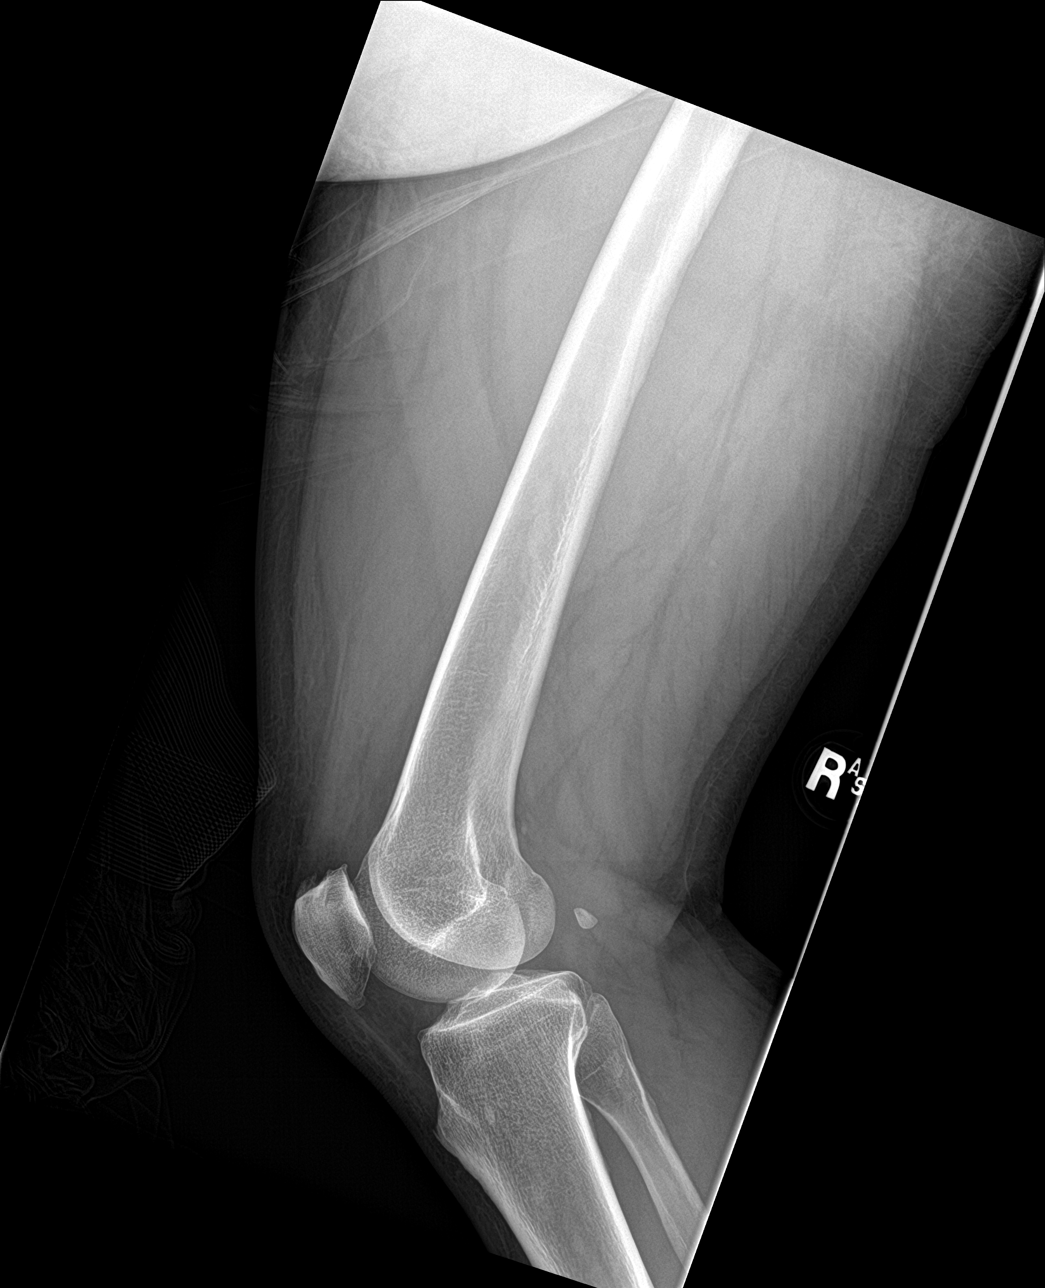

[femur ap (2 of 2)]
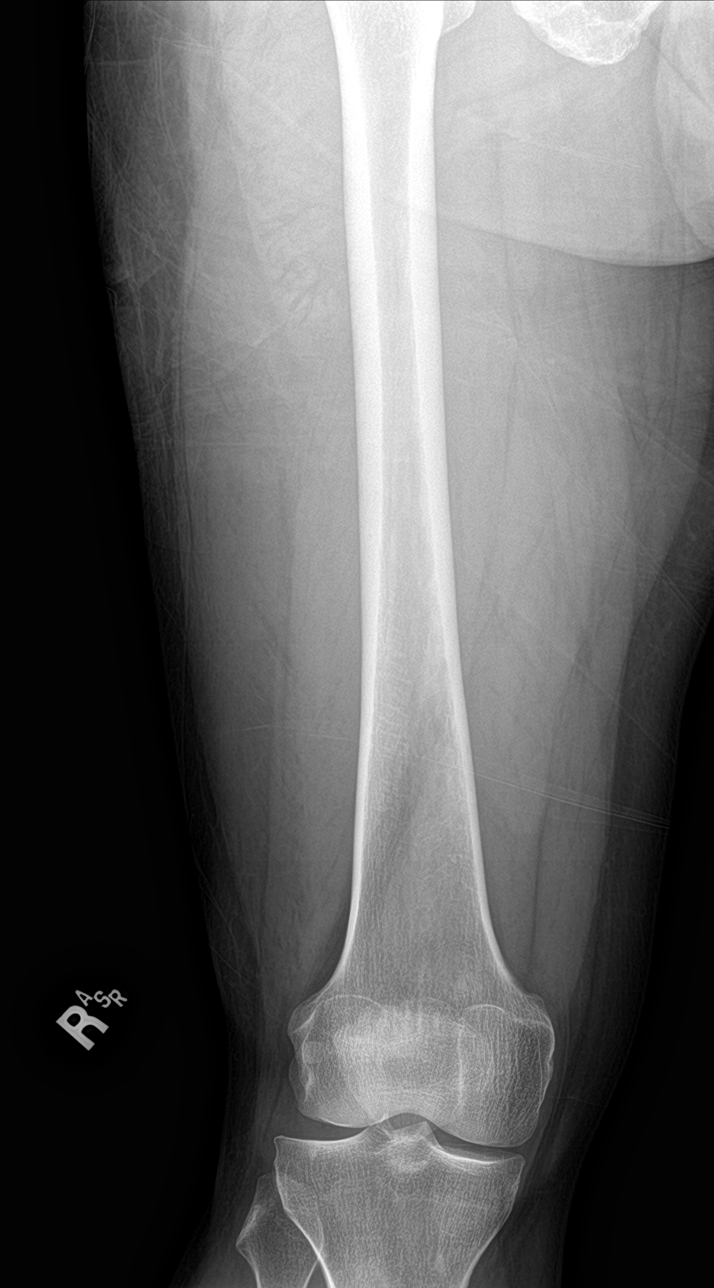

[4 of 4 positions shown; findings below may reference images not displayed]

FINDINGS: There is no evidence of fracture or other focal bone lesions. Soft
tissues are unremarkable.
IMPRESSION: No fracture or dislocation of the right femur.

## 2022-12-10 ENCOUNTER — Emergency Department (HOSPITAL_COMMUNITY): Payer: Self-pay

## 2022-12-10 ENCOUNTER — Encounter (HOSPITAL_COMMUNITY): Payer: Self-pay

## 2022-12-10 ENCOUNTER — Other Ambulatory Visit: Payer: Self-pay

## 2022-12-10 ENCOUNTER — Emergency Department (HOSPITAL_COMMUNITY)
Admission: EM | Admit: 2022-12-10 | Discharge: 2022-12-10 | Disposition: A | Discharge status: Home / Self Care | Payer: Self-pay | Attending: Emergency Medicine | Admitting: Emergency Medicine

## 2022-12-10 DIAGNOSIS — M79604 Pain in right leg: Secondary | ICD-10-CM | POA: Insufficient documentation

## 2022-12-10 DIAGNOSIS — W11XXXA Fall on and from ladder, initial encounter: Secondary | ICD-10-CM | POA: Insufficient documentation

## 2022-12-10 DIAGNOSIS — S52124A Nondisplaced fracture of head of right radius, initial encounter for closed fracture: Secondary | ICD-10-CM | POA: Insufficient documentation

## 2022-12-10 DIAGNOSIS — S0081XA Abrasion of other part of head, initial encounter: Secondary | ICD-10-CM | POA: Insufficient documentation

## 2022-12-10 DIAGNOSIS — W19XXXA Unspecified fall, initial encounter: Secondary | ICD-10-CM

## 2022-12-10 LAB — BASIC METABOLIC PANEL
Anion gap: 8 (ref 5–15)
BUN: 11 mg/dL (ref 6–20)
CO2: 26 mmol/L (ref 22–32)
Calcium: 8.6 mg/dL — ABNORMAL LOW (ref 8.9–10.3)
Chloride: 101 mmol/L (ref 98–111)
Creatinine, Ser: 0.8 mg/dL (ref 0.61–1.24)
GFR, Estimated: >60 mL/min (ref >60)
Glucose, Bld: 120 mg/dL — ABNORMAL HIGH (ref 70–99)
Potassium: 3.2 mmol/L — ABNORMAL LOW (ref 3.5–5.1)
Sodium: 135 mmol/L (ref 135–145)

## 2022-12-10 LAB — CBC WITH DIFFERENTIAL/PLATELET
Abs Immature Granulocytes: 0.1 10*3/uL — ABNORMAL HIGH (ref 0.00–0.07)
Basophils Absolute: 0.1 10*3/uL (ref 0.0–0.1)
Basophils Relative: 0 %
Eosinophils Absolute: 0 10*3/uL (ref 0.0–0.5)
Eosinophils Relative: 0 %
HCT: 44.5 % (ref 39.0–52.0)
Hemoglobin: 15.6 g/dL (ref 13.0–17.0)
Immature Granulocytes: 1 %
Lymphocytes Relative: 6 %
Lymphs Abs: 1.1 10*3/uL (ref 0.7–4.0)
MCH: 30.8 pg (ref 26.0–34.0)
MCHC: 35.1 g/dL (ref 30.0–36.0)
MCV: 87.9 fL (ref 80.0–100.0)
Monocytes Absolute: 0.8 10*3/uL (ref 0.1–1.0)
Monocytes Relative: 4 %
Neutro Abs: 16.5 10*3/uL — ABNORMAL HIGH (ref 1.7–7.7)
Neutrophils Relative %: 89 %
Platelets: 256 10*3/uL (ref 150–400)
RBC: 5.06 MIL/uL (ref 4.22–5.81)
RDW: 12.9 % (ref 11.5–15.5)
WBC: 18.6 10*3/uL — ABNORMAL HIGH (ref 4.0–10.5)
nRBC: 0 % (ref 0.0–0.2)

## 2022-12-10 MED ORDER — HYDROCODONE-ACETAMINOPHEN 5-325 MG PO TABS
2.0000 | ORAL_TABLET | ORAL | 0 refills | Status: DC | PRN
Start: 2022-12-10 — End: 2022-12-20

## 2022-12-10 MED ORDER — FENTANYL CITRATE PF 50 MCG/ML IJ SOSY
50.0000 ug | PREFILLED_SYRINGE | Freq: Once | INTRAMUSCULAR | Status: AC
  Administered 2022-12-10: 50 ug via INTRAVENOUS
  Filled 2022-12-10: qty 1

## 2022-12-10 MED ORDER — ONDANSETRON HCL 4 MG/2ML IJ SOLN
4.0000 mg | Freq: Once | INTRAMUSCULAR | Status: AC
  Administered 2022-12-10: 4 mg via INTRAVENOUS
  Filled 2022-12-10: qty 2

## 2022-12-10 MED ORDER — HYDROCODONE-ACETAMINOPHEN 5-325 MG PO TABS
1.0000 | ORAL_TABLET | Freq: Once | ORAL | Status: AC
  Administered 2022-12-10: 1 via ORAL
  Filled 2022-12-10: qty 1

## 2022-12-10 MED ORDER — LACTATED RINGERS IV BOLUS
1000.0000 mL | Freq: Once | INTRAVENOUS | Status: AC
  Administered 2022-12-10: 1000 mL via INTRAVENOUS

## 2022-12-10 MED ORDER — PROPOFOL 10 MG/ML IV BOLUS
1.0000 mg/kg | Freq: Once | INTRAVENOUS | Status: AC
  Administered 2022-12-10: 100 mg via INTRAVENOUS
  Filled 2022-12-10: qty 20

## 2022-12-10 MED ORDER — PROPOFOL 10 MG/ML IV BOLUS: 0.5000 mg/kg | Freq: Once | INTRAVENOUS | Status: DC

## 2022-12-10 NOTE — ED Triage Notes (Signed)
Pt cleaning gutter, fell off of 10 foot ladder, after ladder fell; landed on r elbow; did not hit head, no loc; c/o pain from R elbow down to R hand swelling noted; pulse and sensation intact, limited movement

## 2022-12-10 NOTE — Consult Note (Signed)
Reason for Consult:Right elbow fx Referring Physician: Thayer Ohm Tegeler Time called: 1319 Time at bedside: 1508   Willie Cox is an 47 y.o. male.  HPI: Willie Cox fell about 10 feet off a ladder while cleaning out some gutters for work. He had immediate right elbow pain. He was brought to the ED where x-rays showed a terrible triad elbow fx and orthopedic surgery was consulted. He is RHD.  History reviewed. No pertinent past medical history.  History reviewed. No pertinent surgical history.  History reviewed. No pertinent family history.  Social History:  reports that he has never smoked. He has never used smokeless tobacco. He reports that he does not drink alcohol and does not use drugs.  Allergies: No Known Allergies  Medications: I have reviewed the patient's current medications.  No results found for this or any previous visit (from the past 48 hour(s)).  DG HIP UNILAT WITH PELVIS 2-3 VIEWS RIGHT  Result Date: 12/10/2022 CLINICAL DATA:  Pain. EXAM: DG HIP (WITH OR WITHOUT PELVIS) 3V RIGHT COMPARISON:  None Available. FINDINGS: There is no evidence of hip fracture or dislocation. There is no evidence of arthropathy or other focal bone abnormality. IMPRESSION: Negative. Electronically Signed   By: Layla Maw M.D.   On: 12/10/2022 14:16   DG Elbow Complete Right  Result Date: 12/10/2022 CLINICAL DATA:  Pain. EXAM: RIGHT ELBOW - COMPLETE 3+ VIEW; RIGHT FOREARM - 2 VIEW; RIGHT HAND - COMPLETE 3+ VIEW COMPARISON:  Right index finger radiographs 12/29/2019 FINDINGS: Right elbow: There is posterior dislocation of the proximal ulna with respect to the distal humerus. The distal humerus rests on the coronoid process on lateral view, and there is likely fracture of the tip of the coronoid process with distal extension of the small fracture fragments. There is also an acute, comminuted fracture of the volar aspect of the radial head with approximately 1.5 cm distal  displacement of the small fracture fragments, best seen on lateral view. There are multiple tiny 2 mm and smaller bone densities within the trochlea-olecranon empty joint space and just posterior to the proximal ulna on lateral view. There also appears to be an approximate 11 mm loose body abutting the trochlear notch of the proximal ulna on lateral view the elbow and lateral view of the forearm. Right forearm: No additional acute fracture is seen within the radius or ulna. Right hand: Minimal second through fifth DIP joint space narrowing. No acute fracture. IMPRESSION: 1. Posterior dislocation of the proximal ulna with respect to the distal humerus. 2. Acute, comminuted fracture of the volar aspect of the radial head with approximately 1.5 cm distal displacement of the small fracture fragments. 3. Multiple bone densities within the trochlea-olecranon empty joint space. Most of these measure 2 mm and smaller, however one loose body appears to measure approximately 11 mm. Electronically Signed   By: Neita Garnet M.D.   On: 12/10/2022 13:22   DG Forearm Right  Result Date: 12/10/2022 CLINICAL DATA:  Pain. EXAM: RIGHT ELBOW - COMPLETE 3+ VIEW; RIGHT FOREARM - 2 VIEW; RIGHT HAND - COMPLETE 3+ VIEW COMPARISON:  Right index finger radiographs 12/29/2019 FINDINGS: Right elbow: There is posterior dislocation of the proximal ulna with respect to the distal humerus. The distal humerus rests on the coronoid process on lateral view, and there is likely fracture of the tip of the coronoid process with distal extension of the small fracture fragments. There is also an acute, comminuted fracture of the volar aspect of the radial head  with approximately 1.5 cm distal displacement of the small fracture fragments, best seen on lateral view. There are multiple tiny 2 mm and smaller bone densities within the trochlea-olecranon empty joint space and just posterior to the proximal ulna on lateral view. There also appears to be an  approximate 11 mm loose body abutting the trochlear notch of the proximal ulna on lateral view the elbow and lateral view of the forearm. Right forearm: No additional acute fracture is seen within the radius or ulna. Right hand: Minimal second through fifth DIP joint space narrowing. No acute fracture. IMPRESSION: 1. Posterior dislocation of the proximal ulna with respect to the distal humerus. 2. Acute, comminuted fracture of the volar aspect of the radial head with approximately 1.5 cm distal displacement of the small fracture fragments. 3. Multiple bone densities within the trochlea-olecranon empty joint space. Most of these measure 2 mm and smaller, however one loose body appears to measure approximately 11 mm. Electronically Signed   By: Neita Garnet M.D.   On: 12/10/2022 13:22   DG Hand Complete Right  Result Date: 12/10/2022 CLINICAL DATA:  Pain. EXAM: RIGHT ELBOW - COMPLETE 3+ VIEW; RIGHT FOREARM - 2 VIEW; RIGHT HAND - COMPLETE 3+ VIEW COMPARISON:  Right index finger radiographs 12/29/2019 FINDINGS: Right elbow: There is posterior dislocation of the proximal ulna with respect to the distal humerus. The distal humerus rests on the coronoid process on lateral view, and there is likely fracture of the tip of the coronoid process with distal extension of the small fracture fragments. There is also an acute, comminuted fracture of the volar aspect of the radial head with approximately 1.5 cm distal displacement of the small fracture fragments, best seen on lateral view. There are multiple tiny 2 mm and smaller bone densities within the trochlea-olecranon empty joint space and just posterior to the proximal ulna on lateral view. There also appears to be an approximate 11 mm loose body abutting the trochlear notch of the proximal ulna on lateral view the elbow and lateral view of the forearm. Right forearm: No additional acute fracture is seen within the radius or ulna. Right hand: Minimal second through fifth  DIP joint space narrowing. No acute fracture. IMPRESSION: 1. Posterior dislocation of the proximal ulna with respect to the distal humerus. 2. Acute, comminuted fracture of the volar aspect of the radial head with approximately 1.5 cm distal displacement of the small fracture fragments. 3. Multiple bone densities within the trochlea-olecranon empty joint space. Most of these measure 2 mm and smaller, however one loose body appears to measure approximately 11 mm. Electronically Signed   By: Neita Garnet M.D.   On: 12/10/2022 13:22    Review of Systems  HENT:  Negative for ear discharge, ear pain, hearing loss and tinnitus.   Eyes:  Negative for photophobia and pain.  Respiratory:  Negative for cough and shortness of breath.   Cardiovascular:  Negative for chest pain.  Gastrointestinal:  Negative for abdominal pain, nausea and vomiting.  Genitourinary:  Negative for dysuria, flank pain, frequency and urgency.  Musculoskeletal:  Positive for arthralgias (Right elbow). Negative for back pain, myalgias and neck pain.  Neurological:  Negative for dizziness and headaches.  Hematological:  Does not bruise/bleed easily.  Psychiatric/Behavioral:  The patient is not nervous/anxious.    Blood pressure (!) 143/101, pulse 73, temperature 99.6 F (37.6 C), temperature source Oral, resp. rate 16, SpO2 95%. Physical Exam Constitutional:      General: He is not in acute  distress.    Appearance: He is well-developed. He is not diaphoretic.  HENT:     Head: Normocephalic and atraumatic.  Eyes:     General: No scleral icterus.       Right eye: No discharge.        Left eye: No discharge.     Conjunctiva/sclera: Conjunctivae normal.  Cardiovascular:     Rate and Rhythm: Normal rate and regular rhythm.  Pulmonary:     Effort: Pulmonary effort is normal. No respiratory distress.  Musculoskeletal:     Cervical back: Normal range of motion.     Comments: Right shoulder, elbow, wrist, digits- no skin wounds,  mod TTP elbow w/swelling, no instability, no blocks to motion  Sens  Ax/R/M/U intact  Mot   Ax/ R/ PIN/ M/ AIN/ U intact  Rad 2+  Skin:    General: Skin is warm and dry.  Neurological:     Mental Status: He is alert.  Psychiatric:        Mood and Affect: Mood normal.        Behavior: Behavior normal.    Assessment/Plan: Right elbow fx -- Plan CR and splinting. F/u with Dr. Hulda Humphrey later this week to discuss surgical repair.    Freeman Caldron, PA-C Orthopedic Surgery [phone] 12/10/2022, 3:15 PM

## 2022-12-10 NOTE — Procedures (Signed)
Procedure: Right elbow closed reduction   Indication: Right elbow fx   Surgeon: Charma Igo, PA-C   Assist: None   Anesthesia: Propofol via EDP   EBL: None   Complications: None   Findings: After risks/benefits explained patient desires to undergo procedure. Consent obtained and time out performed. Sedation given and efficacy confirmed. Elbow reduced and splinted. Pt tolerated the procedure well.       Freeman Caldron, PA-C Orthopedic Surgery [phone]

## 2022-12-10 NOTE — Progress Notes (Signed)
Orthopedic Tech Progress Note Patient Details:  Markez Dowland 10/18/1975 [id number]  Ortho Devices Type of Ortho Device: Long arm splint, Shoulder immobilizer Ortho Device/Splint Location: RUE Ortho Device/Splint Interventions: Ordered, Application, Adjustment   Post Interventions Patient Tolerated: Well Instructions Provided: Care of device, Adjustment of device  Sherilyn Banker 12/10/2022, 4:18 PM

## 2022-12-10 NOTE — ED Provider Triage Note (Signed)
Emergency Medicine Provider Triage Evaluation Note  Willie Cox , a 47 y.o. male  was evaluated in triage.  Pt complains of fall.  Patient states that he is on a ladder cleaning out gutters and the ladder slipped causing him to fall on his right side.  Reports minor trauma to his right jaw/lip.  Denies loss of consciousness, blood thinner use, nausea, vomiting, visual disturbance, gait abnormality, slurred speech, weakness/sensory deficits in upper extremities.  Currently complaining of right arm pain.  Review of Systems  Positive: See above Negative:   Physical Exam  BP (!) 155/114 (BP Location: Left Arm)   Pulse 65   Temp 99.6 F (37.6 C) (Oral)   Resp 18   SpO2 100%  Gen:   Awake, no distress   Resp:  Normal effort  MSK:   Moves extremities without difficulty  Other:  Tender palpation right elbow distally.  Sensation intact.  Radial pulses 2+ bilaterally.  Mild tender palpation of right hip.  Medical Decision Making  Medically screening exam initiated at 12:10 PM.  Appropriate orders placed.  Willie Cox was informed that the remainder of the evaluation will be completed by another provider, this initial triage assessment does not replace that evaluation, and the importance of remaining in the ED until their evaluation is complete.     Peter Garter, [place] 12/10/22 1216

## 2022-12-10 NOTE — ED Notes (Signed)
Pt in NAD at d/c from ED. A&O. Ambulatory. Respirations even & unlabored. Skin warm & Dry. Pt verbalized understanding of d/c including follow up care with ortho, pain management and splint care. No needs or questions expressed at d/c. Spanish interpreter # [phone] used for DC.

## 2022-12-10 NOTE — ED Provider Notes (Signed)
Care of patient received from prior provider at 9:45 PM, please see their note for complete H/P and care plan.  Received handoff per ED course.  Clinical Course as of 12/10/22 2145  Tue Dec 10, 2022  1521 Stable. Fall off of a ladder.  10 feet. No medical history.  Obvious ulnar fx.  Ortho consulted Pending trauma studies [CC]    Clinical Course User Index [CC] Glyn Ade, MD   .Sedation  Date/Time: 12/10/2022 9:43 PM  Performed by: Glyn Ade, MD Authorized by: Glyn Ade, MD   Consent:    Consent obtained:  Verbal   Consent given by:  Patient   Risks discussed:  Allergic reaction, inadequate sedation, prolonged sedation necessitating reversal, prolonged hypoxia resulting in organ damage and respiratory compromise necessitating ventilatory assistance and intubation Universal protocol:    Site/side marked: no     Immediately prior to procedure, a time out was called: yes     Patient identity confirmed:  Anonymous protocol, patient vented/unresponsive Pre-sedation assessment:    Time since last food or drink:  6 hours   ASA classification: class 1 - normal, healthy patient     Mallampati score:  I - soft palate, uvula, fauces, pillars visible   Pre-sedation assessments completed and reviewed: pre-procedure airway patency not reviewed   Procedure details (see MAR for exact dosages):    Total Provider sedation time (minutes):  25 Post-procedure details:    Post-sedation assessment completed:  12/10/2022 9:44 PM   Procedure completion:  Tolerated well, no immediate complications   Reassessment: Patient received prior team at 1500.  He fell 10 feet off a ladder as above.  Sedated as above deficit detailed orthopedic reduction of the right dislocated elbow.  Sedated as above.  Back at baseline on my reassessment.  No other injuries on serial studies.  Reinforced follow-up for surgical fix with orthopedics.  Disposition:  I have considered need for  hospitalization, however, considering all of the above, I believe this patient is stable for discharge at this time.  Patient/family educated about specific return precautions for given chief complaint and symptoms.  Patient/family educated about follow-up with PCP/orthopedics.     Patient/family expressed understanding of return precautions and need for follow-up. Patient spoken to regarding all imaging and laboratory results and appropriate follow up for these results. All education provided in verbal form with additional information in written form. Time was allowed for answering of patient questions. Patient discharged.    Emergency Department Medication Summary:   Medications  HYDROcodone-acetaminophen (NORCO/VICODIN) 5-325 MG per tablet 1 tablet (1 tablet Oral Given 12/10/22 1215)  fentaNYL (SUBLIMAZE) injection 50 mcg (50 mcg Intravenous Given 12/10/22 1439)  propofol (DIPRIVAN) 10 mg/mL bolus/IV push 81.6 mg (100 mg Intravenous Given 12/10/22 1608)  ondansetron (ZOFRAN) injection 4 mg (4 mg Intravenous Given 12/10/22 1545)  lactated ringers bolus 1,000 mL (0 mLs Intravenous Stopped 12/10/22 2024)               Glyn Ade, MD 12/10/22 2145

## 2022-12-10 NOTE — ED Provider Notes (Signed)
[place] EMERGENCY DEPARTMENT AT [facility] Provider Note   CSN: [id number] Arrival date & time: 12/10/22  1154     History  No chief complaint on file.   Willie Cox is a 47 y.o. male with no significant past medical history who presents to the ED after falling off a ladder.  Patient fell roughly 10 feet.  Patient landed directly on his right arm.  Admits to hitting his face.  No LOC.  Not on any blood thinners.  Patient admits to significant right arm pain mostly with palpation and movement.  Also admits to right leg pain.  No headache.  Denies nausea and vomiting. Denies chest pain and shortness of breath.   History obtained from patient and past medical records. Friend acted as Research officer, political party. Declined official interpreter.       Home Medications Prior to Admission medications   Medication Sig Start Date End Date Taking? Authorizing Provider  acetaminophen (TYLENOL) 500 MG tablet Take 1 tablet (500 mg total) by mouth every 6 (six) hours as needed. 08/14/20   Liberty Handy, PA-C  cephALEXin (KEFLEX) 500 MG capsule Take 1 capsule (500 mg total) by mouth 4 (four) times daily. 12/29/19   Cathie Hoops, Amy V, PA-C  diclofenac (VOLTAREN) 50 MG EC tablet Take 1 tablet (50 mg total) by mouth 2 (two) times daily. 12/29/19   Cathie Hoops, Amy V, PA-C  ibuprofen (ADVIL) 600 MG tablet Take 1 tablet (600 mg total) by mouth every 6 (six) hours as needed. 08/14/20   Liberty Handy, PA-C  methocarbamol (ROBAXIN-750) 750 MG tablet Take 1 tablet (750 mg total) by mouth 4 (four) times daily. 08/11/20   Lorre Nick, MD  oxyCODONE-acetaminophen (PERCOCET/ROXICET) 5-325 MG tablet Take 1-2 tablets by mouth every 6 (six) hours as needed for severe pain. 08/11/20   Lorre Nick, MD      Allergies    Patient has no known allergies.    Review of Systems   Review of Systems  Respiratory:  Negative for shortness of breath.   Cardiovascular:  Negative for  chest pain.  Gastrointestinal:  Negative for abdominal pain.  Musculoskeletal:  Positive for arthralgias.    Physical Exam Updated Vital Signs BP (!) 143/101   Pulse 73   Temp 99.6 F (37.6 C) (Oral)   Resp 16   SpO2 95%  Physical Exam Vitals and nursing note reviewed.  Constitutional:      General: He is not in acute distress.    Appearance: He is not ill-appearing.  HENT:     Head: Normocephalic.     Comments: Superficial abrasion to chin. No malocclusion.  Eyes:     Pupils: Pupils are equal, round, and reactive to light.  Neck:     Comments: No cervical midline tenderness Cardiovascular:     Rate and Rhythm: Normal rate and regular rhythm.     Pulses: Normal pulses.     Heart sounds: Normal heart sounds. No murmur heard.    No friction rub. No gallop.  Pulmonary:     Effort: Pulmonary effort is normal.     Breath sounds: Normal breath sounds.  Abdominal:     General: Abdomen is flat. There is no distension.     Palpations: Abdomen is soft.     Tenderness: There is no abdominal tenderness. There is no guarding or rebound.  Musculoskeletal:        General: Normal range of motion.     Cervical  back: Neck supple.     Comments: TTP throughout right elbow with decreased ROM. Radial pulse intact. Soft compartments.   Skin:    General: Skin is warm and dry.  Neurological:     General: No focal deficit present.     Mental Status: He is alert.  Psychiatric:        Mood and Affect: Mood normal.        Behavior: Behavior normal.     ED Results / Procedures / Treatments   Labs (all labs ordered are listed, but only abnormal results are displayed) Labs Reviewed  CBC WITH DIFFERENTIAL/PLATELET  BASIC METABOLIC PANEL    EKG None  Radiology DG HIP UNILAT WITH PELVIS 2-3 VIEWS RIGHT  Result Date: 12/10/2022 CLINICAL DATA:  Pain. EXAM: DG HIP (WITH OR WITHOUT PELVIS) 3V RIGHT COMPARISON:  None Available. FINDINGS: There is no evidence of hip fracture or dislocation.  There is no evidence of arthropathy or other focal bone abnormality. IMPRESSION: Negative. Electronically Signed   By: Layla Maw M.D.   On: 12/10/2022 14:16   DG Elbow Complete Right  Result Date: 12/10/2022 CLINICAL DATA:  Pain. EXAM: RIGHT ELBOW - COMPLETE 3+ VIEW; RIGHT FOREARM - 2 VIEW; RIGHT HAND - COMPLETE 3+ VIEW COMPARISON:  Right index finger radiographs 12/29/2019 FINDINGS: Right elbow: There is posterior dislocation of the proximal ulna with respect to the distal humerus. The distal humerus rests on the coronoid process on lateral view, and there is likely fracture of the tip of the coronoid process with distal extension of the small fracture fragments. There is also an acute, comminuted fracture of the volar aspect of the radial head with approximately 1.5 cm distal displacement of the small fracture fragments, best seen on lateral view. There are multiple tiny 2 mm and smaller bone densities within the trochlea-olecranon empty joint space and just posterior to the proximal ulna on lateral view. There also appears to be an approximate 11 mm loose body abutting the trochlear notch of the proximal ulna on lateral view the elbow and lateral view of the forearm. Right forearm: No additional acute fracture is seen within the radius or ulna. Right hand: Minimal second through fifth DIP joint space narrowing. No acute fracture. IMPRESSION: 1. Posterior dislocation of the proximal ulna with respect to the distal humerus. 2. Acute, comminuted fracture of the volar aspect of the radial head with approximately 1.5 cm distal displacement of the small fracture fragments. 3. Multiple bone densities within the trochlea-olecranon empty joint space. Most of these measure 2 mm and smaller, however one loose body appears to measure approximately 11 mm. Electronically Signed   By: Neita Garnet M.D.   On: 12/10/2022 13:22   DG Forearm Right  Result Date: 12/10/2022 CLINICAL DATA:  Pain. EXAM: RIGHT ELBOW -  COMPLETE 3+ VIEW; RIGHT FOREARM - 2 VIEW; RIGHT HAND - COMPLETE 3+ VIEW COMPARISON:  Right index finger radiographs 12/29/2019 FINDINGS: Right elbow: There is posterior dislocation of the proximal ulna with respect to the distal humerus. The distal humerus rests on the coronoid process on lateral view, and there is likely fracture of the tip of the coronoid process with distal extension of the small fracture fragments. There is also an acute, comminuted fracture of the volar aspect of the radial head with approximately 1.5 cm distal displacement of the small fracture fragments, best seen on lateral view. There are multiple tiny 2 mm and smaller bone densities within the trochlea-olecranon empty joint space and just posterior  to the proximal ulna on lateral view. There also appears to be an approximate 11 mm loose body abutting the trochlear notch of the proximal ulna on lateral view the elbow and lateral view of the forearm. Right forearm: No additional acute fracture is seen within the radius or ulna. Right hand: Minimal second through fifth DIP joint space narrowing. No acute fracture. IMPRESSION: 1. Posterior dislocation of the proximal ulna with respect to the distal humerus. 2. Acute, comminuted fracture of the volar aspect of the radial head with approximately 1.5 cm distal displacement of the small fracture fragments. 3. Multiple bone densities within the trochlea-olecranon empty joint space. Most of these measure 2 mm and smaller, however one loose body appears to measure approximately 11 mm. Electronically Signed   By: Neita Garnet M.D.   On: 12/10/2022 13:22   DG Hand Complete Right  Result Date: 12/10/2022 CLINICAL DATA:  Pain. EXAM: RIGHT ELBOW - COMPLETE 3+ VIEW; RIGHT FOREARM - 2 VIEW; RIGHT HAND - COMPLETE 3+ VIEW COMPARISON:  Right index finger radiographs 12/29/2019 FINDINGS: Right elbow: There is posterior dislocation of the proximal ulna with respect to the distal humerus. The distal humerus  rests on the coronoid process on lateral view, and there is likely fracture of the tip of the coronoid process with distal extension of the small fracture fragments. There is also an acute, comminuted fracture of the volar aspect of the radial head with approximately 1.5 cm distal displacement of the small fracture fragments, best seen on lateral view. There are multiple tiny 2 mm and smaller bone densities within the trochlea-olecranon empty joint space and just posterior to the proximal ulna on lateral view. There also appears to be an approximate 11 mm loose body abutting the trochlear notch of the proximal ulna on lateral view the elbow and lateral view of the forearm. Right forearm: No additional acute fracture is seen within the radius or ulna. Right hand: Minimal second through fifth DIP joint space narrowing. No acute fracture. IMPRESSION: 1. Posterior dislocation of the proximal ulna with respect to the distal humerus. 2. Acute, comminuted fracture of the volar aspect of the radial head with approximately 1.5 cm distal displacement of the small fracture fragments. 3. Multiple bone densities within the trochlea-olecranon empty joint space. Most of these measure 2 mm and smaller, however one loose body appears to measure approximately 11 mm. Electronically Signed   By: Neita Garnet M.D.   On: 12/10/2022 13:22    Procedures Procedures    Medications Ordered in ED Medications  HYDROcodone-acetaminophen (NORCO/VICODIN) 5-325 MG per tablet 1 tablet (1 tablet Oral Given 12/10/22 1215)  fentaNYL (SUBLIMAZE) injection 50 mcg (50 mcg Intravenous Given 12/10/22 1439)    ED Course/ Medical Decision Making/ A&P                                 Medical Decision Making Amount and/or Complexity of Data Reviewed Independent Historian: friend Labs: ordered. Decision-making details documented in ED Course. Radiology: ordered and independent interpretation performed. Decision-making details documented in ED  Course.  Risk Prescription drug management.   This patient presents to the ED for concern of fall, this involves an extensive number of treatment options, and is a complaint that carries with it a high risk of complications and morbidity.  The differential diagnosis includes bony fracture, intracranial bleed, PTX, etc  47 year old male presents to the ED after falling off a ladder.  Fell roughly 10 feet.  Fell directly on his right side.  Admits to hitting his face.  No LOC.  Not on any blood thinners.  Patient admits to right elbow and forearm pain.  Denies visual and speech changes.  No nausea or vomiting.  Also admits to right hip pain.  Upon arrival, stable vitals.  Patient in no acute distress.  Significant tenderness to right elbow with decreased range of motion.  Soft compartments.  Low suspicion for compartment syndrome.  Radial pulse intact.  X-rays ordered in triage.  Added CT head, cervical spine, maxillofacial given minor trauma to face.  Fentanyl given.  X-rays personally reviewed and interpreted which demonstrates posterior dislocation of the proximal ulna and also demonstrates an acute comminuted fracture of the volar aspect of the radial head. Discussed with Charma Igo, PA-C with orthopedics who recommends reduction.   Patient handed off to [employer], PA-C at shift change pending CT scans and reduction.   Lives at home Language barrier       Final Clinical Impression(s) / ED Diagnoses Final diagnoses:  Fall, initial encounter  Closed nondisplaced fracture of head of right radius, initial encounter    Rx / DC Orders ED Discharge Orders     None         Jesusita Oka 12/10/22 1516    Tegeler, Canary Brim, MD 12/10/22 [phone]

## 2022-12-13 ENCOUNTER — Ambulatory Visit: Payer: Self-pay | Admitting: Physician Assistant

## 2022-12-13 ENCOUNTER — Telehealth: Payer: Self-pay

## 2022-12-13 NOTE — Telephone Encounter (Signed)
West Bali came to me this morning and noticed patient scheduled with her this afternoon for fracture follow up from ER. ER provider had already consulted with Dr Hulda Humphrey who was on call and advised have patient follow up with him.  Unfortunately when patient d/c from ER, per West Bali they gave [facility] contact number instead of [facility]. Can you please reach out to patient and cancel appointment for this afternoon and provide contact information for patient contact Dr Maryclare Labrador office? Thanks.

## 2022-12-16 ENCOUNTER — Other Ambulatory Visit: Payer: Self-pay | Admitting: Orthopedic Surgery

## 2022-12-18 ENCOUNTER — Encounter (HOSPITAL_COMMUNITY): Payer: Self-pay

## 2022-12-18 ENCOUNTER — Encounter (HOSPITAL_COMMUNITY)
Admission: RE | Admit: 2022-12-18 | Discharge: 2022-12-18 | Disposition: A | Discharge status: Home / Self Care | Payer: Self-pay | Source: Ambulatory Visit | Attending: Orthopedic Surgery | Admitting: Orthopedic Surgery

## 2022-12-18 ENCOUNTER — Other Ambulatory Visit: Payer: Self-pay

## 2022-12-18 VITALS — BP 163/105 | HR 68 | Temp 98.5°F | Resp 16 | Wt 170.0 lb

## 2022-12-18 DIAGNOSIS — Z01818 Encounter for other preprocedural examination: Secondary | ICD-10-CM | POA: Insufficient documentation

## 2022-12-18 LAB — CBC
HCT: 44.9 % (ref 39.0–52.0)
Hemoglobin: 15.4 g/dL (ref 13.0–17.0)
MCH: 30.8 pg (ref 26.0–34.0)
MCHC: 34.3 g/dL (ref 30.0–36.0)
MCV: 89.8 fL (ref 80.0–100.0)
Platelets: 308 10*3/uL (ref 150–400)
RBC: 5 MIL/uL (ref 4.22–5.81)
RDW: 12.6 % (ref 11.5–15.5)
WBC: 10.7 10*3/uL — ABNORMAL HIGH (ref 4.0–10.5)
nRBC: 0 % (ref 0.0–0.2)

## 2022-12-18 LAB — BASIC METABOLIC PANEL
Anion gap: 8 (ref 5–15)
BUN: 14 mg/dL (ref 6–20)
CO2: 28 mmol/L (ref 22–32)
Calcium: 8.9 mg/dL (ref 8.9–10.3)
Chloride: 102 mmol/L (ref 98–111)
Creatinine, Ser: 0.57 mg/dL — ABNORMAL LOW (ref 0.61–1.24)
GFR, Estimated: >60 mL/min (ref >60)
Glucose, Bld: 109 mg/dL — ABNORMAL HIGH (ref 70–99)
Potassium: 3.9 mmol/L (ref 3.5–5.1)
Sodium: 138 mmol/L (ref 135–145)

## 2022-12-18 NOTE — Progress Notes (Signed)
COVID Vaccine Completed: yes  Date of COVID positive in last 90 days: no  PCP - n/a Cardiologist - n/a  Chest x-ray - n/a EKG - 12/18/22 Epic/chart Stress Test - n/a ECHO - n/a Cardiac Cath - n/a Pacemaker/ICD device last checked: n/a Spinal Cord Stimulator: n/a  Bowel Prep - no  Sleep Study - n/a CPAP -   Fasting Blood Sugar - n/a Checks Blood Sugar _____ times a day  Last dose of GLP1 agonist-  N/A GLP1 instructions:  N/A   Last dose of SGLT-2 inhibitors-  N/A SGLT-2 instructions: N/A   Blood Thinner Instructions: n/a Aspirin Instructions: Last Dose:  Activity level: Can go up a flight of stairs and perform activities of daily living without stopping and without symptoms of chest pain or shortness of breath.  Anesthesia review: BP 163/105 and 158/101. Denies history of HTN or symptoms. Will obtain BMP, CBC, and EKG.  Patient denies shortness of breath, fever, cough and chest pain at PAT appointment  Patient verbalized understanding of instructions that were given to them at the PAT appointment. Patient was also instructed that they will need to review over the PAT instructions again at home before surgery.

## 2022-12-19 ENCOUNTER — Ambulatory Visit (HOSPITAL_COMMUNITY): Payer: PRIVATE HEALTH INSURANCE | Admitting: Certified Registered Nurse Anesthetist

## 2022-12-19 ENCOUNTER — Encounter (HOSPITAL_COMMUNITY)
Admission: RE | Disposition: A | Discharge status: Home / Self Care | Payer: Self-pay | Source: Home / Self Care | Attending: Orthopedic Surgery

## 2022-12-19 ENCOUNTER — Observation Stay (HOSPITAL_COMMUNITY)
Admission: RE | Admit: 2022-12-19 | Discharge: 2022-12-20 | Disposition: A | Discharge status: Home / Self Care | Payer: PRIVATE HEALTH INSURANCE | Attending: Orthopedic Surgery | Admitting: Orthopedic Surgery

## 2022-12-19 ENCOUNTER — Other Ambulatory Visit: Payer: Self-pay

## 2022-12-19 ENCOUNTER — Ambulatory Visit (HOSPITAL_COMMUNITY): Payer: PRIVATE HEALTH INSURANCE

## 2022-12-19 ENCOUNTER — Encounter (HOSPITAL_COMMUNITY): Payer: Self-pay | Admitting: Orthopedic Surgery

## 2022-12-19 DIAGNOSIS — S53004A Unspecified dislocation of right radial head, initial encounter: Principal | ICD-10-CM | POA: Insufficient documentation

## 2022-12-19 DIAGNOSIS — S52044A Nondisplaced fracture of coronoid process of right ulna, initial encounter for closed fracture: Secondary | ICD-10-CM | POA: Insufficient documentation

## 2022-12-19 DIAGNOSIS — M24021 Loose body in right elbow: Secondary | ICD-10-CM | POA: Diagnosis not present

## 2022-12-19 DIAGNOSIS — Z96621 Presence of right artificial elbow joint: Principal | ICD-10-CM | POA: Diagnosis present

## 2022-12-19 DIAGNOSIS — S53104A Unspecified dislocation of right ulnohumeral joint, initial encounter: Secondary | ICD-10-CM | POA: Diagnosis present

## 2022-12-19 DIAGNOSIS — S52121A Displaced fracture of head of right radius, initial encounter for closed fracture: Secondary | ICD-10-CM

## 2022-12-19 DIAGNOSIS — S5331XA Traumatic rupture of right ulnar collateral ligament, initial encounter: Secondary | ICD-10-CM | POA: Insufficient documentation

## 2022-12-19 DIAGNOSIS — W11XXXA Fall on and from ladder, initial encounter: Secondary | ICD-10-CM | POA: Diagnosis not present

## 2022-12-19 HISTORY — PX: ORIF ELBOW FRACTURE: SHX5031

## 2022-12-19 HISTORY — PX: FOREIGN BODY REMOVAL: SHX962

## 2022-12-19 SURGERY — OPEN REDUCTION INTERNAL FIXATION (ORIF) ELBOW/OLECRANON FRACTURE
Anesthesia: General | Site: Elbow | Laterality: Right

## 2022-12-19 MED ORDER — DEXAMETHASONE SODIUM PHOSPHATE 4 MG/ML IJ SOLN
INTRAMUSCULAR | Status: DC | PRN
  Administered 2022-12-19: 10 mg via INTRAVENOUS

## 2022-12-19 MED ORDER — CHLORHEXIDINE GLUCONATE 0.12 % MT SOLN
15.0000 mL | Freq: Once | OROMUCOSAL | Status: AC
  Administered 2022-12-19: 15 mL via OROMUCOSAL

## 2022-12-19 MED ORDER — AMISULPRIDE (ANTIEMETIC) 5 MG/2ML IV SOLN
10.0000 mg | Freq: Once | INTRAVENOUS | Status: AC | PRN
  Administered 2022-12-19: 10 mg via INTRAVENOUS

## 2022-12-19 MED ORDER — FENTANYL CITRATE PF 50 MCG/ML IJ SOSY: 100.0000 ug | PREFILLED_SYRINGE | Freq: Once | INTRAMUSCULAR | Status: DC

## 2022-12-19 MED ORDER — LACTATED RINGERS IV SOLN: INTRAVENOUS | Status: DC

## 2022-12-19 MED ORDER — FENTANYL CITRATE (PF) 100 MCG/2ML IJ SOLN
INTRAMUSCULAR | Status: DC | PRN
  Administered 2022-12-19 (×2): 100 ug via INTRAVENOUS
  Administered 2022-12-19 (×3): 50 ug via INTRAVENOUS

## 2022-12-19 MED ORDER — ORAL CARE MOUTH RINSE: 15.0000 mL | Freq: Once | OROMUCOSAL | Status: AC

## 2022-12-19 MED ORDER — SUGAMMADEX SODIUM 200 MG/2ML IV SOLN
INTRAVENOUS | Status: DC | PRN
  Administered 2022-12-19: 200 mg via INTRAVENOUS

## 2022-12-19 MED ORDER — ROCURONIUM BROMIDE 10 MG/ML (PF) SYRINGE
PREFILLED_SYRINGE | INTRAVENOUS | Status: DC | PRN
  Administered 2022-12-19: 60 mg via INTRAVENOUS

## 2022-12-19 MED ORDER — DEXMEDETOMIDINE HCL IN NACL 80 MCG/20ML IV SOLN
INTRAVENOUS | Status: DC | PRN
Start: 2022-12-19 — End: 2022-12-19
  Administered 2022-12-19: 8 ug via INTRAVENOUS

## 2022-12-19 MED ORDER — CEFAZOLIN SODIUM-DEXTROSE 2-4 GM/100ML-% IV SOLN
2.0000 g | INTRAVENOUS | Status: AC
  Administered 2022-12-19: 2 g via INTRAVENOUS
  Filled 2022-12-19: qty 100

## 2022-12-19 MED ORDER — KETOROLAC TROMETHAMINE 30 MG/ML IJ SOLN
INTRAMUSCULAR | Status: DC | PRN
Start: 2022-12-19 — End: 2022-12-19
  Administered 2022-12-19: 30 mg via INTRAVENOUS

## 2022-12-19 MED ORDER — METOPROLOL TARTRATE 5 MG/5ML IV SOLN
2.5000 mg | Freq: Once | INTRAVENOUS | Status: AC
  Administered 2022-12-19: 2.5 mg via INTRAVENOUS

## 2022-12-19 MED ORDER — LIDOCAINE 2% (20 MG/ML) 5 ML SYRINGE
INTRAMUSCULAR | Status: DC | PRN
  Administered 2022-12-19: 60 mg via INTRAVENOUS

## 2022-12-19 MED ORDER — PROPOFOL 10 MG/ML IV BOLUS
INTRAVENOUS | Status: DC | PRN
  Administered 2022-12-19: 200 mg via INTRAVENOUS

## 2022-12-19 MED ORDER — MIDAZOLAM HCL 2 MG/2ML IJ SOLN
2.0000 mg | Freq: Once | INTRAMUSCULAR | Status: AC
  Administered 2022-12-19: 2 mg via INTRAVENOUS

## 2022-12-19 MED ORDER — 0.9 % SODIUM CHLORIDE (POUR BTL) OPTIME
TOPICAL | Status: DC | PRN
  Administered 2022-12-19: 1000 mL

## 2022-12-19 MED ORDER — OXYCODONE HCL 5 MG PO TABS
5.0000 mg | ORAL_TABLET | ORAL | 0 refills | Status: AC | PRN
Start: 2022-12-19 — End: 2022-12-26

## 2022-12-19 MED ORDER — BUPIVACAINE-EPINEPHRINE (PF) 0.5% -1:200000 IJ SOLN
INTRAMUSCULAR | Status: DC | PRN
Start: 2022-12-19 — End: 2022-12-19
  Administered 2022-12-19: 30 mL via PERINEURAL

## 2022-12-19 MED ORDER — KETOROLAC TROMETHAMINE 30 MG/ML IJ SOLN: 30.0000 mg | Freq: Once | INTRAMUSCULAR | Status: DC | PRN

## 2022-12-19 MED ORDER — ACETAMINOPHEN 10 MG/ML IV SOLN: 1000.0000 mg | Freq: Once | INTRAVENOUS | Status: DC

## 2022-12-19 MED ORDER — BUPIVACAINE HCL 0.25 % IJ SOLN
INTRAMUSCULAR | Status: DC | PRN
Start: 2022-12-19 — End: 2022-12-19
  Administered 2022-12-19: 10 mL

## 2022-12-19 MED ORDER — ONDANSETRON HCL 4 MG/2ML IJ SOLN
INTRAMUSCULAR | Status: DC | PRN
  Administered 2022-12-19: 4 mg via INTRAVENOUS

## 2022-12-19 MED ORDER — OXYCODONE HCL 5 MG/5ML PO SOLN: 5.0000 mg | Freq: Once | ORAL | Status: AC | PRN

## 2022-12-19 MED ORDER — FENTANYL CITRATE PF 50 MCG/ML IJ SOSY
25.0000 ug | PREFILLED_SYRINGE | INTRAMUSCULAR | Status: DC | PRN
  Administered 2022-12-19 (×3): 50 ug via INTRAVENOUS

## 2022-12-19 MED ORDER — OXYCODONE HCL 5 MG PO TABS
5.0000 mg | ORAL_TABLET | Freq: Once | ORAL | Status: AC | PRN
  Administered 2022-12-19: 5 mg via ORAL

## 2022-12-19 MED ORDER — ACETAMINOPHEN 500 MG PO TABS
1000.0000 mg | ORAL_TABLET | Freq: Once | ORAL | Status: AC
  Administered 2022-12-19: 1000 mg via ORAL
  Filled 2022-12-19: qty 2

## 2022-12-19 SURGICAL SUPPLY — 70 items
ANCH SUT 1 SHRT SM RGD INSRTR (Anchor) ×2 IMPLANT
ANCHOR SUT 1.45 SZ 1 SHORT (Anchor) IMPLANT
APL PRP STRL LF DISP 70% ISPRP (MISCELLANEOUS) ×2
APL SKNCLS STERI-STRIP NONHPOA (GAUZE/BANDAGES/DRESSINGS)
BANDAGE ESMARK 6X9 LF (GAUZE/BANDAGES/DRESSINGS) ×2 IMPLANT
BENZOIN TINCTURE PRP APPL 2/3 (GAUZE/BANDAGES/DRESSINGS) IMPLANT
BIT DRILL 2.9 CANN QC NONSTRL (BIT) IMPLANT
BLADE OSCILLATING/SAGITTAL (BLADE) ×2
BLADE SURG 15 STRL LF DISP TIS (BLADE) ×4 IMPLANT
BLADE SURG 15 STRL SS (BLADE) ×6
BLADE SW THK.38XMED LNG THN (BLADE) IMPLANT
BNDG CMPR 5X4 CHSV STRCH STRL (GAUZE/BANDAGES/DRESSINGS)
BNDG CMPR 5X4 KNIT ELC UNQ LF (GAUZE/BANDAGES/DRESSINGS) ×4
BNDG CMPR 9X4 STRL LF SNTH (GAUZE/BANDAGES/DRESSINGS)
BNDG CMPR 9X6 STRL LF SNTH (GAUZE/BANDAGES/DRESSINGS) ×2
BNDG COHESIVE 4X5 TAN STRL LF (GAUZE/BANDAGES/DRESSINGS) IMPLANT
BNDG ELASTIC 4INX 5YD STR LF (GAUZE/BANDAGES/DRESSINGS) ×4 IMPLANT
BNDG ESMARK 4X9 LF (GAUZE/BANDAGES/DRESSINGS) IMPLANT
BNDG ESMARK 6X9 LF (GAUZE/BANDAGES/DRESSINGS) ×2
BNDG GAUZE DERMACEA FLUFF 4 (GAUZE/BANDAGES/DRESSINGS) IMPLANT
BNDG GZE DERMACEA 4 6PLY (GAUZE/BANDAGES/DRESSINGS) ×4
CHLORAPREP W/TINT 26 (MISCELLANEOUS) ×2 IMPLANT
COVER BACK TABLE 60X90IN (DRAPES) ×2 IMPLANT
CUFF TOURN SGL QUICK 24 (TOURNIQUET CUFF) ×2
CUFF TRNQT CYL 24X4X16.5-23 (TOURNIQUET CUFF) ×2 IMPLANT
DRAPE C-ARM 42X120 X-RAY (DRAPES) ×2 IMPLANT
DRAPE C-ARMOR (DRAPES) IMPLANT
DRAPE EXTREMITY T 121X128X90 (DISPOSABLE) ×2 IMPLANT
DRAPE IMP U-DRAPE 54X76 (DRAPES) ×2 IMPLANT
DRAPE U-SHAPE 47X51 STRL (DRAPES) ×2 IMPLANT
DRSG ADAPTIC 3X8 NADH LF (GAUZE/BANDAGES/DRESSINGS) IMPLANT
ELECT REM PT RETURN 15FT ADLT (MISCELLANEOUS) ×2 IMPLANT
GAUZE SPONGE 4X4 12PLY STRL (GAUZE/BANDAGES/DRESSINGS) ×2 IMPLANT
GAUZE XEROFORM 1X8 LF (GAUZE/BANDAGES/DRESSINGS) ×2 IMPLANT
GLOVE BIO SURGEON STRL SZ7.5 (GLOVE) ×2 IMPLANT
GLOVE BIOGEL PI IND STRL 8 (GLOVE) ×2 IMPLANT
GOWN STRL REUS W/ TWL LRG LVL3 (GOWN DISPOSABLE) ×2 IMPLANT
GOWN STRL REUS W/TWL LRG LVL3 (GOWN DISPOSABLE) ×2
IMPL HEAD (Orthopedic Implant) IMPLANT
IMPLANT HEAD (Orthopedic Implant) ×2 IMPLANT
K-WIRE ACE 1.6X6 (WIRE) ×4
KIT BASIN OR (CUSTOM PROCEDURE TRAY) ×2 IMPLANT
KWIRE ACE 1.6X6 (WIRE) IMPLANT
NS IRRIG 1000ML POUR BTL (IV SOLUTION) ×2 IMPLANT
PAD CAST 4YDX4 CTTN HI CHSV (CAST SUPPLIES) ×2 IMPLANT
PADDING CAST COTTON 4X4 STRL (CAST SUPPLIES) ×6
PADDING CAST SYNTHETIC 4X4 STR (CAST SUPPLIES) ×2 IMPLANT
PENCIL SMOKE EVACUATOR (MISCELLANEOUS) ×2 IMPLANT
RETRIEVER SUT HEWSON (MISCELLANEOUS) IMPLANT
SCOTCHCAST PLUS 3X4 WHITE (CAST SUPPLIES) IMPLANT
SHEET MEDIUM DRAPE 40X70 STRL (DRAPES) ×2 IMPLANT
SLEEVE SCD COMPRESS KNEE MED (STOCKING) ×2 IMPLANT
SLING ARM FOAM STRAP LRG (SOFTGOODS) IMPLANT
SPIKE FLUID TRANSFER (MISCELLANEOUS) IMPLANT
SPLINT FIBERGLASS 4X30 (CAST SUPPLIES) ×2 IMPLANT
SPONGE T-LAP 18X18 ~~LOC~~+RFID (SPONGE) IMPLANT
STEM IMPLANT W SCREW (Stem) IMPLANT
STRIP CLOSURE SKIN 1/2X4 (GAUZE/BANDAGES/DRESSINGS) IMPLANT
SUCTION TUBE FRAZIER 10FR DISP (SUCTIONS) ×2 IMPLANT
SUT ETHILON 3 0 PS 1 (SUTURE) ×2 IMPLANT
SUT FIBERWIRE #2 38 REV NDL BL (SUTURE) ×2
SUT MNCRL AB 3-0 PS2 18 (SUTURE) ×2 IMPLANT
SUT PDS AB 2-0 CT2 27 (SUTURE) ×2 IMPLANT
SUT VIC AB 0 CT2 27 (SUTURE) IMPLANT
SUT VIC AB 3-0 FS2 27 (SUTURE) IMPLANT
SUTURE FIBERWR#2 38 REV NDL BL (SUTURE) IMPLANT
SYR BULB EAR ULCER 3OZ GRN STR (SYRINGE) ×2 IMPLANT
TOWEL GREEN STERILE FF (TOWEL DISPOSABLE) ×4 IMPLANT
TUBING CONNECTING 10 (TUBING) ×2 IMPLANT
UNDERPAD 30X36 HEAVY ABSORB (UNDERPADS AND DIAPERS) ×2 IMPLANT

## 2022-12-19 NOTE — Brief Op Note (Signed)
12/19/2022  5:09 PM  PATIENT:  Meda Klinefelter Arcides Marsh Dolly  47 y.o. male  PRE-OPERATIVE DIAGNOSIS:  RIGHT ELBOW DISLOCATION, RIGHT RADIAL HEAD FRACTURE, RIGHT CORONOID FRACTURE, RIGHT LATERAL ULNAR COLLATERAL LIGAMENT TEAR  POST-OPERATIVE DIAGNOSIS:  RIGHT ELBOW DISLOCATION, RIGHT RADIAL HEAD FRACTURE, RIGHT CORONOID FRACTURE, RIGHT LATERAL ULNAR COLLATERAL LIGAMENT TEAR  PROCEDURE:  Procedure(s): RIGHT RADIAL HEAD ARTHROPLASTY, RIGHT CORONOID OPEN REDUCATION INTERNAL FIXATION, RIGHT LATERAL ULNAR COLLATERAL LIGAMENT REPAIR, REMOVAL OF LOOSE BONE (Right) REMOVAL LOOSE BODIES (Right)  SURGEON:  Surgeons and Role:    * Luci Bank, MD - Primary    * Jones Broom, MD - Co-Surgeon  PHYSICIAN ASSISTANT:   ASSISTANTS: Danielle Rankin, OPA-C    ANESTHESIA:   general  EBL:  100 mL   BLOOD ADMINISTERED:none  DRAINS: none   LOCAL MEDICATIONS USED:  MARCAINE     SPECIMEN:  No Specimen  DISPOSITION OF SPECIMEN:  N/A  COUNTS:  YES  TOURNIQUET:   Total Tourniquet Time Documented: Upper Arm (Right) - 121 minutes Total: Upper Arm (Right) - 121 minutes   DICTATION: .Note written in EPIC  PLAN OF CARE: Discharge to home after PACU  PATIENT DISPOSITION:  PACU - hemodynamically stable.   Delay start of Pharmacological VTE agent (>24hrs) due to surgical blood loss or risk of bleeding: not applicable

## 2022-12-19 NOTE — Anesthesia Procedure Notes (Signed)
Anesthesia Regional Block: Supraclavicular block   Pre-Anesthetic Checklist: , timeout performed,  Correct Patient, Correct Site, Correct Laterality,  Correct Procedure, Correct Position, site marked,  Risks and benefits discussed,  Surgical consent,  Pre-op evaluation,  At surgeon's request and post-op pain management  Laterality: Right  Prep: chloraprep       Needles:  Injection technique: Single-shot  Needle Type: Echogenic Stimulator Needle     Needle Length: 9cm  Needle Gauge: 21     Additional Needles:   Procedures:,,,, ultrasound used (permanent image in chart),,    Narrative:  Start time: 12/19/2022 6:00 PM End time: 12/19/2022 6:10 PM Injection made incrementally with aspirations every 5 mL.  Performed by: Personally  Anesthesiologist: Leonides Grills, MD  Additional Notes: Functioning IV was confirmed and monitors were applied.  A timeout was performed. Sterile prep, hand hygiene and sterile gloves were used. A 90mm 21ga Arrow echogenic stimulator needle was used. Negative aspiration and negative test dose prior to incremental administration of local anesthetic. The patient tolerated the procedure well.  Ultrasound guidance: relevent anatomy identified, needle position confirmed, local anesthetic spread visualized around nerve(s), vascular puncture avoided.  Image printed for medical record.

## 2022-12-19 NOTE — Anesthesia Preprocedure Evaluation (Addendum)
Anesthesia Evaluation  Patient identified by MRN, date of birth, ID band Patient awake    Reviewed: Allergy & Precautions, NPO status , Patient's Chart, lab work & pertinent test results  Airway Mallampati: II  TM Distance: >3 FB Neck ROM: Full    Dental no notable dental hx. (+) Chipped,    Pulmonary neg pulmonary ROS   Pulmonary exam normal        Cardiovascular negative cardio ROS Normal cardiovascular exam     Neuro/Psych negative neurological ROS  negative psych ROS   GI/Hepatic negative GI ROS, Neg liver ROS,,,  Endo/Other  negative endocrine ROS    Renal/GU negative Renal ROS     Musculoskeletal   Abdominal   Peds  Hematology negative hematology ROS (+)   Anesthesia Other Findings RIGHT ELBOW DISLOCATION, RIGHT RADIAL HEAD FRACTURE, RIGHT CORONOID FRACTURE, RIGHT LATERAL ULNAR COLLATERAL LIGAMENT TEAR  Reproductive/Obstetrics                             Anesthesia Physical Anesthesia Plan  ASA: 1  Anesthesia Plan: General   Post-op Pain Management:    Induction: Intravenous  PONV Risk Score and Plan: 2 and Ondansetron, Dexamethasone, Midazolam and Treatment may vary due to age or medical condition  Airway Management Planned: Oral ETT  Additional Equipment:   Intra-op Plan:   Post-operative Plan: Extubation in OR  Informed Consent: I have reviewed the patients History and Physical, chart, labs and discussed the procedure including the risks, benefits and alternatives for the proposed anesthesia with the patient or authorized representative who has indicated his/her understanding and acceptance.     Dental advisory given and Interpreter used for interveiw  Plan Discussed with: CRNA and Surgeon  Anesthesia Plan Comments: (Potential post-op regional anesthesia discussed with patient and surgeon)       Anesthesia Quick Evaluation

## 2022-12-19 NOTE — Anesthesia Procedure Notes (Signed)
Procedure Name: Intubation Date/Time: 12/19/2022 1:10 PM  Performed by: Vanessa Tutwiler, CRNAPre-anesthesia Checklist: Patient identified, Emergency Drugs available, Suction available and Patient being monitored Patient Re-evaluated:Patient Re-evaluated prior to induction Oxygen Delivery Method: Circle system utilized Preoxygenation: Pre-oxygenation with 100% oxygen Induction Type: IV induction Ventilation: Mask ventilation without difficulty Laryngoscope Size: 2 and Miller Grade View: Grade I Tube type: Oral Tube size: 7.5 mm Number of attempts: 1 Airway Equipment and Method: Stylet Placement Confirmation: ETT inserted through vocal cords under direct vision, positive ETCO2 and breath sounds checked- equal and bilateral Secured at: 22 cm Tube secured with: Tape Dental Injury: Teeth and Oropharynx as per pre-operative assessment

## 2022-12-19 NOTE — H&P (Signed)
Willie Cox Nurse is an 47 y.o. male.   Chief Complaint: Right elbow injury HPI: Willie Cox is a 47 year old male fell about 10 feet off a ladder while cleaning out some gutters for work on 12/10/22. He had immediate right elbow pain. He was brought to the ED where x-rays showed a terrible triad elbow fx and orthopedic surgery was consulted.  He was reduced and placed in a posterior splint and referred for follow-up.  No past medical history on file.  No past surgical history on file.  No family history on file. Social History:  reports that he has never smoked. He has never used smokeless tobacco. He reports that he does not drink alcohol and does not use drugs.  Allergies: No Known Allergies  Medications Prior to Admission  Medication Sig Dispense Refill   oxyCODONE-acetaminophen (PERCOCET/ROXICET) 5-325 MG tablet Take 1 tablet by mouth every 6 (six) hours as needed for moderate pain. (Patient not taking: Reported on 12/18/2022)     acetaminophen (TYLENOL) 500 MG tablet Take 1 tablet (500 mg total) by mouth every 6 (six) hours as needed. 30 tablet 0   cephALEXin (KEFLEX) 500 MG capsule Take 1 capsule (500 mg total) by mouth 4 (four) times daily. 28 capsule 0   diclofenac (VOLTAREN) 50 MG EC tablet Take 1 tablet (50 mg total) by mouth 2 (two) times daily. 20 tablet 0   HYDROcodone-acetaminophen (NORCO) 10-325 MG tablet Take 1 tablet by mouth every 6 (six) hours as needed for moderate pain.     HYDROcodone-acetaminophen (NORCO/VICODIN) 5-325 MG tablet Take 2 tablets by mouth every 4 (four) hours as needed. 10 tablet 0   ibuprofen (ADVIL) 600 MG tablet Take 1 tablet (600 mg total) by mouth every 6 (six) hours as needed. 30 tablet 0   methocarbamol (ROBAXIN-750) 750 MG tablet Take 1 tablet (750 mg total) by mouth 4 (four) times daily. 30 tablet 0   oxyCODONE-acetaminophen (PERCOCET/ROXICET) 5-325 MG tablet Take 1-2 tablets by mouth every 6 (six) hours as needed for severe pain. 20 tablet  0    Results for orders placed or performed during the hospital encounter of 12/18/22 (from the past 48 hour(s))  CBC per protocol     Status: Abnormal   Collection Time: 12/18/22  9:30 AM  Result Value Ref Range   WBC 10.7 (H) 4.0 - 10.5 K/uL   RBC 5.00 4.22 - 5.81 MIL/uL   Hemoglobin 15.4 13.0 - 17.0 g/dL   HCT 24.4 01.0 - 27.2 %   MCV 89.8 80.0 - 100.0 fL   MCH 30.8 26.0 - 34.0 pg   MCHC 34.3 30.0 - 36.0 g/dL   RDW 53.6 64.4 - 03.4 %   Platelets 308 150 - 400 K/uL   nRBC 0.0 0.0 - 0.2 %    Comment: Performed at [facility], [address], [place], [place] [address]  Basic metabolic panel     Status: Abnormal   Collection Time: 12/18/22  9:30 AM  Result Value Ref Range   Sodium 138 135 - 145 mmol/L   Potassium 3.9 3.5 - 5.1 mmol/L   Chloride 102 98 - 111 mmol/L   CO2 28 22 - 32 mmol/L   Glucose, Bld 109 (H) 70 - 99 mg/dL    Comment: Glucose reference range applies only to samples taken after fasting for at least 8 hours.   BUN 14 6 - 20 mg/dL   Creatinine, Ser 5.63 (L) 0.61 - 1.24 mg/dL  Calcium 8.9 8.9 - 10.3 mg/dL   GFR, Estimated >95 >28 mL/min    Comment: (NOTE) Calculated using the CKD-EPI Creatinine Equation (2021)    Anion gap 8 5 - 15    Comment: Performed at [facility], [address] [place], [place] [address]   No results found.  Review of Systems  Constitutional: Negative.   HENT: Negative.    Respiratory: Negative.    Cardiovascular: Negative.   Genitourinary: Negative.   Musculoskeletal:  Positive for arthralgias.  Neurological: Negative.   Psychiatric/Behavioral: Negative.      There were no vitals taken for this visit. Physical Exam Constitutional:      Appearance: Normal appearance.  HENT:     Head: Normocephalic.  Cardiovascular:     Rate and Rhythm: Normal rate and regular rhythm.     Pulses: Normal pulses.     Heart sounds: Normal heart sounds.  Pulmonary:     Effort: Pulmonary effort is  normal.     Breath sounds: Normal breath sounds.  Abdominal:     General: Abdomen is flat.     Palpations: Abdomen is soft.  Musculoskeletal:     Comments: Right elbow Skin intact Significant swelling and ecchymoses Range of motion deferred Tender over her radial head with palpable crepitus Motor intact AIN/PIN/U Sensation intact M/R/U 2+ radial pulse, and warm well perfused   Skin:    Capillary Refill: Capillary refill takes less than 2 seconds.  Neurological:     General: No focal deficit present.     Mental Status: He is alert and oriented to person, place, and time.  Psychiatric:        Mood and Affect: Mood normal.        Behavior: Behavior normal.      Assessment 47 year old male with a right elbow dislocation with radial head fracture, coronoid tip fracture and intra-articular loose bodies  Plan Right elbow radial head open reduction internal fixation versus radial head arthroplasty, coronoid tip open reduction internal fixation, lateral ulnar collateral ligament repair and removal of loose bodies and any other indicated procedures  Risks, benefits and alternatives of surgery as with patient and his daughter.  These risks include but not limited to infection, bleeding, damage to neurovascular structures, wrist drop, stiffness, need for further surgery, failure of procedure, and anesthetic couple occasions including stroke, myocardial infarction or even death.  The patient states he understands these risks and wished to proceed with surgery  Luci Bank, MD 12/19/2022, 11:13 AM

## 2022-12-19 NOTE — Discharge Instructions (Addendum)
Ramond Marrow MD, MPH Alfonse Alpers, PA-C [facility] [address], [address] [phone] (tel)   [phone] (fax)   POST-OPERATIVE INSTRUCTIONS - SHOULDER ARTHROSCOPY  WOUND CARE Do not remove your dressing. Keep it clean dry and intact   You may change/reinforce the bandage as needed.  Use the Cryocuff or Ice as often as possible for the first 7 days, then as needed for pain relief. Always keep a towel, ACE wrap or other barrier between the cooling unit and your skin.   EXERCISES Wear the sling at all times   It is normal for your fingers/hand to become more swollen after surgery and discolored from bruising.   This will resolve over the first few weeks usually after surgery. Please continue to ambulate and do not stay sitting or lying for too long.  Perform foot and wrist pumps to assist in circulation.  PHYSICAL THERAPY - You will begin physical therapy soon after surgery (unless otherwise specified) - Please call to set up an appointment, if you do not already have one  - Let our office if there are any issues with scheduling your therapy   REGIONAL ANESTHESIA (NERVE BLOCKS) The anesthesia team may have performed a nerve block for you this is a great tool used to minimize pain.   The block may start wearing off overnight (between 8-24 hours postop) When the block wears off, your pain may go from nearly zero to the pain you would have had postop without the block. This is an abrupt transition but nothing dangerous is happening.   This can be a challenging period but utilize your as needed pain medications to try and manage this period. We suggest you use the pain medication the first night prior to going to bed, to ease this transition.  You may take an extra dose of narcotic when this happens if needed  POST-OP MEDICATIONS- Multimodal approach to pain control In general your pain will be controlled with a combination of substances.  Prescriptions  unless otherwise discussed are electronically sent to your pharmacy.  This is a carefully made plan we use to minimize narcotic use.     Ibuprofen - Anti-inflammatory medication taken on a scheduled basis Acetaminophen - Non-narcotic pain medicine taken on a scheduled basis  Oxycodone - This is a strong narcotic, to be used only on an "as needed" basis for SEVERE pain.  FOLLOW-UP If you develop a Fever (>=101.5), Redness or Drainage from the surgical incision site, please call our office to arrange for an evaluation. Please call the office to schedule a follow-up appointment for your first post-operative appointment, 7-10 days post-operatively.  Helpful information We suggest you use the pain medication the first night prior to going to bed, in order to ease any pain when the anesthesia wears off. You should avoid taking pain medications on an empty stomach as it will make you nauseous.  You should wean off your narcotic medicines as soon as you are able.  Most patients will be off narcotics before their first postop appointment.   Do not drink alcoholic beverages or take illicit drugs when taking pain medications.  It is against the law to drive while taking narcotics.  In some states it is against the law to drive while your arm is in a sling.   Pain medication may make you constipated.  Below are a few solutions to try in this order: Decrease the amount of pain medication if you aren't having pain. Drink lots  of decaffeinated fluids. Drink prune juice and/or eat dried prunes  If the first 3 don't work start with additional solutions Take Colace - an over-the-counter stool softener Take Senokot - an over-the-counter laxative Take Miralax - a stronger over-the-counter laxative

## 2022-12-19 NOTE — Progress Notes (Signed)
Assisted Dr. Roanna Banning with right, supraclavicular, ultrasound guided block. Side rails up, monitors on throughout procedure. See vital signs in flow sheet. Tolerated Procedure well.

## 2022-12-19 NOTE — Transfer of Care (Signed)
Immediate Anesthesia Transfer of Care Note  Patient: Denis Arcides Marsh Dolly  Procedure(s) Performed: Procedure(s): RIGHT RADIAL HEAD ARTHROPLASTY, RIGHT CORONOID OPEN REDUCATION INTERNAL FIXATION, RIGHT LATERAL ULNAR COLLATERAL LIGAMENT REPAIR, REMOVAL OF LOOSE BONE (Right) REMOVAL LOOSE BODIES (Right)  Patient Location: PACU  Anesthesia Type:General  Level of Consciousness:  sedated, patient cooperative and responds to stimulation  Airway & Oxygen Therapy:Patient Spontanous Breathing and Patient connected to face mask oxgen  Post-op Assessment:  Report given to PACU RN and Post -op Vital signs reviewed and stable  Post vital signs:  Reviewed and stable  Last Vitals:  Vitals:   12/19/22 1118  BP: (!) 154/99  Pulse: 83  Resp: 18  Temp: 36.7 C  SpO2: 98%    Complications: No apparent anesthesia complications

## 2022-12-19 NOTE — Progress Notes (Signed)
Patient with heart rate 100-120 and diastolic BP greater than 100. Patient not complaining of pain or nausea. Ellender MDA notified and at bedside. Discussion with patient (using iPad interpreter) and patient stated he was not ready to go home and felt like he need to stay in the hospital tonight. Wham MD notified for admission orders. Patient and patient's daughter made aware.

## 2022-12-20 ENCOUNTER — Encounter (HOSPITAL_COMMUNITY): Payer: Self-pay | Admitting: Orthopedic Surgery

## 2022-12-20 DIAGNOSIS — S53004A Unspecified dislocation of right radial head, initial encounter: Secondary | ICD-10-CM | POA: Diagnosis not present

## 2022-12-20 MED ORDER — OXYCODONE HCL 5 MG PO TABS
5.0000 mg | ORAL_TABLET | ORAL | Status: DC | PRN
  Administered 2022-12-20 (×2): 5 mg via ORAL
  Filled 2022-12-20 (×2): qty 1

## 2022-12-20 MED ORDER — METOCLOPRAMIDE HCL 5 MG/ML IJ SOLN: 5.0000 mg | Freq: Three times a day (TID) | INTRAMUSCULAR | Status: DC | PRN

## 2022-12-20 MED ORDER — ONDANSETRON HCL 4 MG/2ML IJ SOLN
4.0000 mg | Freq: Four times a day (QID) | INTRAMUSCULAR | Status: DC | PRN
  Administered 2022-12-20: 4 mg via INTRAVENOUS
  Filled 2022-12-20: qty 2

## 2022-12-20 MED ORDER — METHOCARBAMOL 1000 MG/10ML IJ SOLN: 500.0000 mg | Freq: Four times a day (QID) | INTRAVENOUS | Status: DC | PRN

## 2022-12-20 MED ORDER — DOCUSATE SODIUM 100 MG PO CAPS
100.0000 mg | ORAL_CAPSULE | Freq: Two times a day (BID) | ORAL | Status: DC
  Administered 2022-12-20: 100 mg via ORAL
  Filled 2022-12-20: qty 1

## 2022-12-20 MED ORDER — METHOCARBAMOL 500 MG PO TABS: 500.0000 mg | ORAL_TABLET | Freq: Four times a day (QID) | ORAL | Status: DC | PRN

## 2022-12-20 MED ORDER — METOCLOPRAMIDE HCL 5 MG PO TABS: 5.0000 mg | ORAL_TABLET | Freq: Three times a day (TID) | ORAL | Status: DC | PRN

## 2022-12-20 MED ORDER — CEFAZOLIN SODIUM-DEXTROSE 1-4 GM/50ML-% IV SOLN
1.0000 g | Freq: Four times a day (QID) | INTRAVENOUS | Status: DC
  Administered 2022-12-20 (×2): 1 g via INTRAVENOUS
  Filled 2022-12-20 (×3): qty 50

## 2022-12-20 MED ORDER — ONDANSETRON HCL 4 MG PO TABS: 4.0000 mg | ORAL_TABLET | Freq: Three times a day (TID) | ORAL | 1 refills | Status: AC | PRN

## 2022-12-20 MED ORDER — ONDANSETRON HCL 4 MG PO TABS: 4.0000 mg | ORAL_TABLET | Freq: Four times a day (QID) | ORAL | Status: DC | PRN

## 2022-12-20 NOTE — Progress Notes (Signed)
.  Subjective: 1 Day Post-Op Procedure(s) (LRB): RIGHT RADIAL HEAD ARTHROPLASTY, RIGHT CORONOID OPEN REDUCATION INTERNAL FIXATION, RIGHT LATERAL ULNAR COLLATERAL LIGAMENT REPAIR, REMOVAL OF LOOSE BONE (Right) REMOVAL LOOSE BODIES (Right)  Admitted over night due to nause and dizziness post op. Doing well this morning. Feels ready to go home  Activity level:  NWB RUE Diet tolerance:  as tolerated Voiding:  as tolerated Patient reports pain as mild.    Objective: Vital signs in last 24 hours: Temp:  [97.1 F (36.2 C)-98.7 F (37.1 C)] 98.7 F (37.1 C) (10/11 0524) Pulse Rate:  [83-123] 93 (10/11 0524) Resp:  [9-23] 17 (10/11 0524) BP: (111-160)/(71-106) 112/71 (10/11 0524) SpO2:  [91 %-99 %] 96 % (10/11 0524) Weight:  [77.1 kg] 77.1 kg (10/10 1117)  Labs: Recent Labs    12/18/22 0930  HGB 15.4   Recent Labs    12/18/22 0930  WBC 10.7*  RBC 5.00  HCT 44.9  PLT 308   Recent Labs    12/18/22 0930  NA 138  K 3.9  CL 102  CO2 28  BUN 14  CREATININE 0.57*  GLUCOSE 109*  CALCIUM 8.9   No results for input(s): "LABPT", "INR" in the last 72 hours.  Physical Exam:  Neurologically intact Neurovascular intact Sensation intact distally Intact pulses distally Incision: dressing C/D/I  Assessment/Plan:  1 Day Post-Op Procedure(s) (LRB): RIGHT RADIAL HEAD ARTHROPLASTY, RIGHT CORONOID OPEN REDUCATION INTERNAL FIXATION, RIGHT LATERAL ULNAR COLLATERAL LIGAMENT REPAIR, REMOVAL OF LOOSE BONE (Right) REMOVAL LOOSE BODIES (Right)  Doing well. Nausea and pain improving. Plan for discharge home today. Patient will follow up in 7-10 days in the office.   Luci Bank 12/20/2022, 8:17 AM

## 2022-12-23 NOTE — Discharge Summary (Signed)
.Patient ID: Willie Cox MRN: [id number] DOB/AGE: January 13, 1976 47 y.o.  Admit date: 12/19/2022 Discharge date: 12/23/2022  Admission Diagnoses:  Principal Problem:   History of arthroplasty of right elbow Active Problems:   Closed dislocation of right elbow   Discharge Diagnoses:  Same  History reviewed. No pertinent past medical history.  Surgeries: Procedure(s): RIGHT RADIAL HEAD ARTHROPLASTY, RIGHT CORONOID OPEN REDUCATION INTERNAL FIXATION, RIGHT LATERAL ULNAR COLLATERAL LIGAMENT REPAIR, REMOVAL OF LOOSE BONE REMOVAL LOOSE BODIES on 12/19/2022   Consultants:   Discharged Condition: Improved  Hospital Course: Willie Cox is an 47 y.o. male who was admitted 12/19/2022 for operative treatment ofHistory of arthroplasty of right elbow. Patient has severe unremitting pain that affects sleep, daily activities, and work/hobbies. After pre-op clearance the patient was taken to the operating room on 12/19/2022 and underwent  Procedure(s): RIGHT RADIAL HEAD ARTHROPLASTY, RIGHT CORONOID OPEN REDUCATION INTERNAL FIXATION, RIGHT LATERAL ULNAR COLLATERAL LIGAMENT REPAIR, REMOVAL OF LOOSE BONE REMOVAL LOOSE BODIES.    Patient was given perioperative antibiotics:  Anti-infectives (From admission, onward)    Start     Dose/Rate Route Frequency Ordered Stop   12/20/22 0115  ceFAZolin (ANCEF) IVPB 1 g/50 mL premix  Status:  Discontinued        1 g 100 mL/hr over 30 Minutes Intravenous Every 6 hours 12/20/22 0111 12/20/22 0933   12/19/22 1115  ceFAZolin (ANCEF) IVPB 2g/100 mL premix        2 g 200 mL/hr over 30 Minutes Intravenous On call to O.R. 12/19/22 1114 12/19/22 1318        Patient was given sequential compression devices, early ambulation, and chemoprophylaxis to prevent DVT.  Patient benefited maximally from hospital stay and there were no complications.    Recent vital signs: No data found.   Recent laboratory studies: No results for  input(s): "WBC", "HGB", "HCT", "PLT", "NA", "K", "CL", "CO2", "BUN", "CREATININE", "GLUCOSE", "INR", "CALCIUM" in the last 72 hours.  Invalid input(s): "PT", "2"   Discharge Medications:   Allergies as of 12/20/2022   No Known Allergies      Medication List     STOP taking these medications    HYDROcodone-acetaminophen 10-325 MG tablet Commonly known as: NORCO   HYDROcodone-acetaminophen 5-325 MG tablet Commonly known as: NORCO/VICODIN       TAKE these medications    acetaminophen 500 MG tablet Commonly known as: TYLENOL Take 1 tablet (500 mg total) by mouth every 6 (six) hours as needed.   cephALEXin 500 MG capsule Commonly known as: KEFLEX Take 1 capsule (500 mg total) by mouth 4 (four) times daily.   diclofenac 50 MG EC tablet Commonly known as: VOLTAREN Take 1 tablet (50 mg total) by mouth 2 (two) times daily.   ibuprofen 600 MG tablet Commonly known as: ADVIL Take 1 tablet (600 mg total) by mouth every 6 (six) hours as needed.   methocarbamol 750 MG tablet Commonly known as: Robaxin-750 Take 1 tablet (750 mg total) by mouth 4 (four) times daily.   ondansetron 4 MG tablet Commonly known as: Zofran Take 1 tablet (4 mg total) by mouth every 8 (eight) hours as needed for nausea or vomiting.   oxyCODONE 5 MG immediate release tablet Commonly known as: Roxicodone Take 1 tablet (5 mg total) by mouth every 4 (four) hours as needed for up to 7 days for severe pain.   oxyCODONE-acetaminophen 5-325 MG tablet Commonly known as: PERCOCET/ROXICET Take 1 tablet by mouth every 6 (six) hours as needed  for moderate pain.   oxyCODONE-acetaminophen 5-325 MG tablet Commonly known as: PERCOCET/ROXICET Take 1-2 tablets by mouth every 6 (six) hours as needed for severe pain.        Diagnostic Studies: DG Elbow 2 Views Right  Result Date: 12/19/2022 CLINICAL DATA:  Elective surgery. EXAM: RIGHT ELBOW - 2 VIEW COMPARISON:  Preoperative imaging. FINDINGS: Three  fluoroscopic spot views of the elbow obtained in the operating room. Radial head arthroplasty. Fluoroscopy time 1 minutes 22 seconds. Dose 2.52 mGy. IMPRESSION: Intraoperative fluoroscopy during radial head arthroplasty. Electronically Signed   By: Narda Rutherford M.D.   On: 12/19/2022 17:32   DG C-Arm 1-60 Min-No Report  Result Date: 12/19/2022 Fluoroscopy was utilized by the requesting physician.  No radiographic interpretation.   DG C-Arm 1-60 Min-No Report  Result Date: 12/19/2022 Fluoroscopy was utilized by the requesting physician.  No radiographic interpretation.   DG C-Arm 1-60 Min-No Report  Result Date: 12/19/2022 Fluoroscopy was utilized by the requesting physician.  No radiographic interpretation.   DG C-Arm 1-60 Min-No Report  Result Date: 12/19/2022 Fluoroscopy was utilized by the requesting physician.  No radiographic interpretation.   CT Head Wo Contrast  Result Date: 12/10/2022 CLINICAL DATA:  Larey Seat off 10 foot ladder EXAM: CT HEAD WITHOUT CONTRAST CT MAXILLOFACIAL WITHOUT CONTRAST CT CERVICAL SPINE WITHOUT CONTRAST TECHNIQUE: Multidetector CT imaging of the head, cervical spine, and maxillofacial structures were performed using the standard protocol without intravenous contrast. Multiplanar CT image reconstructions of the cervical spine and maxillofacial structures were also generated. RADIATION DOSE REDUCTION: This exam was performed according to the departmental dose-optimization program which includes automated exposure control, adjustment of the mA and/or kV according to patient size and/or use of iterative reconstruction technique. COMPARISON:  CT brain and cervical spine 08/11/2020 FINDINGS: CT HEAD FINDINGS Brain: No hemorrhage or intracranial mass. Focal hypodensity within the right frontal white matter, series 3, image 20 consistent with age indeterminate focus of small vessel disease or small infarct. No mass effect. Normal ventricle size Vascular: No hyperdense  vessels.  No unexpected calcification Skull: Normal. Negative for fracture or focal lesion. Other: None CT MAXILLOFACIAL FINDINGS Osseous: Mastoid air cells are clear. Mandibular heads are normally position. Pterygoid plates and zygomatic arches are intact. No acute nasal bone fracture Orbits: Negative. No traumatic or inflammatory finding. Sinuses: No acute fluid levels.  No sinus wall fracture. Soft tissues: Negative. CT CERVICAL SPINE FINDINGS Alignment: No subluxation.  Facet alignment is within normal limits. Skull base and vertebrae: No acute fracture. No primary bone lesion or focal pathologic process. Soft tissues and spinal canal: No prevertebral fluid or swelling. No visible canal hematoma. Disc levels:  Mild degenerative change C5-C6. Upper chest: Negative. Other: None IMPRESSION: 1. Negative for acute intracranial hemorrhage. Small focus of age indeterminate small vessel disease or small infarct in the right frontal white matter. 2. Negative for acute facial bone fracture. 3. Negative for acute osseous abnormality of the cervical spine. Electronically Signed   By: Jasmine Pang M.D.   On: 12/10/2022 19:54   CT Maxillofacial Wo Contrast  Result Date: 12/10/2022 CLINICAL DATA:  Larey Seat off 10 foot ladder EXAM: CT HEAD WITHOUT CONTRAST CT MAXILLOFACIAL WITHOUT CONTRAST CT CERVICAL SPINE WITHOUT CONTRAST TECHNIQUE: Multidetector CT imaging of the head, cervical spine, and maxillofacial structures were performed using the standard protocol without intravenous contrast. Multiplanar CT image reconstructions of the cervical spine and maxillofacial structures were also generated. RADIATION DOSE REDUCTION: This exam was performed according to the departmental dose-optimization program which  includes automated exposure control, adjustment of the mA and/or kV according to patient size and/or use of iterative reconstruction technique. COMPARISON:  CT brain and cervical spine 08/11/2020 FINDINGS: CT HEAD FINDINGS  Brain: No hemorrhage or intracranial mass. Focal hypodensity within the right frontal white matter, series 3, image 20 consistent with age indeterminate focus of small vessel disease or small infarct. No mass effect. Normal ventricle size Vascular: No hyperdense vessels.  No unexpected calcification Skull: Normal. Negative for fracture or focal lesion. Other: None CT MAXILLOFACIAL FINDINGS Osseous: Mastoid air cells are clear. Mandibular heads are normally position. Pterygoid plates and zygomatic arches are intact. No acute nasal bone fracture Orbits: Negative. No traumatic or inflammatory finding. Sinuses: No acute fluid levels.  No sinus wall fracture. Soft tissues: Negative. CT CERVICAL SPINE FINDINGS Alignment: No subluxation.  Facet alignment is within normal limits. Skull base and vertebrae: No acute fracture. No primary bone lesion or focal pathologic process. Soft tissues and spinal canal: No prevertebral fluid or swelling. No visible canal hematoma. Disc levels:  Mild degenerative change C5-C6. Upper chest: Negative. Other: None IMPRESSION: 1. Negative for acute intracranial hemorrhage. Small focus of age indeterminate small vessel disease or small infarct in the right frontal white matter. 2. Negative for acute facial bone fracture. 3. Negative for acute osseous abnormality of the cervical spine. Electronically Signed   By: Jasmine Pang M.D.   On: 12/10/2022 19:54   CT Cervical Spine Wo Contrast  Result Date: 12/10/2022 CLINICAL DATA:  Larey Seat off 10 foot ladder EXAM: CT HEAD WITHOUT CONTRAST CT MAXILLOFACIAL WITHOUT CONTRAST CT CERVICAL SPINE WITHOUT CONTRAST TECHNIQUE: Multidetector CT imaging of the head, cervical spine, and maxillofacial structures were performed using the standard protocol without intravenous contrast. Multiplanar CT image reconstructions of the cervical spine and maxillofacial structures were also generated. RADIATION DOSE REDUCTION: This exam was performed according to the  departmental dose-optimization program which includes automated exposure control, adjustment of the mA and/or kV according to patient size and/or use of iterative reconstruction technique. COMPARISON:  CT brain and cervical spine 08/11/2020 FINDINGS: CT HEAD FINDINGS Brain: No hemorrhage or intracranial mass. Focal hypodensity within the right frontal white matter, series 3, image 20 consistent with age indeterminate focus of small vessel disease or small infarct. No mass effect. Normal ventricle size Vascular: No hyperdense vessels.  No unexpected calcification Skull: Normal. Negative for fracture or focal lesion. Other: None CT MAXILLOFACIAL FINDINGS Osseous: Mastoid air cells are clear. Mandibular heads are normally position. Pterygoid plates and zygomatic arches are intact. No acute nasal bone fracture Orbits: Negative. No traumatic or inflammatory finding. Sinuses: No acute fluid levels.  No sinus wall fracture. Soft tissues: Negative. CT CERVICAL SPINE FINDINGS Alignment: No subluxation.  Facet alignment is within normal limits. Skull base and vertebrae: No acute fracture. No primary bone lesion or focal pathologic process. Soft tissues and spinal canal: No prevertebral fluid or swelling. No visible canal hematoma. Disc levels:  Mild degenerative change C5-C6. Upper chest: Negative. Other: None IMPRESSION: 1. Negative for acute intracranial hemorrhage. Small focus of age indeterminate small vessel disease or small infarct in the right frontal white matter. 2. Negative for acute facial bone fracture. 3. Negative for acute osseous abnormality of the cervical spine. Electronically Signed   By: Jasmine Pang M.D.   On: 12/10/2022 19:54   CT ELBOW RIGHT WO CONTRAST  Result Date: 12/10/2022 CLINICAL DATA:  Patient fell off of a ladder. Elbow trauma. Elbow radiographs demonstrated fracture dislocation which was reduced. EXAM: CT  OF THE UPPER RIGHT EXTREMITY WITHOUT CONTRAST TECHNIQUE: Multidetector CT imaging of  the upper right extremity was performed according to the standard protocol. RADIATION DOSE REDUCTION: This exam was performed according to the departmental dose-optimization program which includes automated exposure control, adjustment of the mA and/or kV according to patient size and/or use of iterative reconstruction technique. COMPARISON:  Right elbow radiographs 12/10/2022 FINDINGS: Bones/Joint/Cartilage Fracture of the radial head extending to the articular surface with volar displacement of the smaller fracture fragment. There is a 5 mm defect in the articular cortical surface. Comminuted fractures of the proximal ulna with displaced ulnar coronoid process fracture and several other small displaced fracture fragments likely arising from the olecranon and possibly the humeral trochlear region. Intra-articular fragments are demonstrated. There is residual partial posterior subluxation of the ulna with respect to the distal humerus. Ligaments Suboptimally assessed by CT. Muscles and Tendons No intramuscular mass or hematoma is identified. Soft tissues Subcutaneous soft tissue infiltration of the olecranon region likely contusion. IMPRESSION: Comminuted fractures of the right elbow as discussed. Electronically Signed   By: Burman Nieves M.D.   On: 12/10/2022 19:19   DG Elbow 2 Views Right  Result Date: 12/10/2022 CLINICAL DATA:  Postreduction.  Fall off 10+ height of ladder. EXAM: RIGHT ELBOW - 2 VIEW COMPARISON:  Right forearm and right elbow radiographs 12/10/2022 FINDINGS: Interval reduction of the distal humerus with the proximal radius and ulna, improved from prior. There is also improved alignment of the volar radial head fracture, however there appears to be persistent 3-5 mm distal displacement of the smaller volar fracture component. The previously seen loose bodies within the humerus-trochlear notch joint space on prior radiographs at time of shoulder dislocation are less well visualized, however  there are multiple tiny bone densities that appear to overlie the elbow joint on frontal view and there again may be a dominant approximate 10 mm ossicle within the trochlea-olecranon joint space on lateral view. IMPRESSION: 1. Interval reduction of the distal humerus with the proximal radius and ulna, improved from prior. 2. Improved alignment of the volar radial head fracture, however there appears to be persistent 3-5 mm distal displacement of the smaller volar fracture component. 3. Loose bodies as previously seen. Electronically Signed   By: Neita Garnet M.D.   On: 12/10/2022 17:03   DG Ribs Unilateral W/Chest Right  Result Date: 12/10/2022 CLINICAL DATA:  injury EXAM: RIGHT RIBS AND CHEST - 3+ VIEW COMPARISON:  None Available. FINDINGS: Bilateral lung fields are clear. Bilateral costophrenic angles are clear. Normal cardio-mediastinal silhouette. No acute osseous abnormalities. Old healed fracture of right ninth rib noted. No acute rib fracture.  No focal rib lesion. The soft tissues are within normal limits. IMPRESSION: No acute cardiopulmonary process. No acute rib fracture. Electronically Signed   By: Jules Schick M.D.   On: 12/10/2022 16:26   DG HIP UNILAT WITH PELVIS 2-3 VIEWS RIGHT  Result Date: 12/10/2022 CLINICAL DATA:  Pain. EXAM: DG HIP (WITH OR WITHOUT PELVIS) 3V RIGHT COMPARISON:  None Available. FINDINGS: There is no evidence of hip fracture or dislocation. There is no evidence of arthropathy or other focal bone abnormality. IMPRESSION: Negative. Electronically Signed   By: Layla Maw M.D.   On: 12/10/2022 14:16   DG Elbow Complete Right  Result Date: 12/10/2022 CLINICAL DATA:  Pain. EXAM: RIGHT ELBOW - COMPLETE 3+ VIEW; RIGHT FOREARM - 2 VIEW; RIGHT HAND - COMPLETE 3+ VIEW COMPARISON:  Right index finger radiographs 12/29/2019 FINDINGS: Right elbow: There is  posterior dislocation of the proximal ulna with respect to the distal humerus. The distal humerus rests on the coronoid  process on lateral view, and there is likely fracture of the tip of the coronoid process with distal extension of the small fracture fragments. There is also an acute, comminuted fracture of the volar aspect of the radial head with approximately 1.5 cm distal displacement of the small fracture fragments, best seen on lateral view. There are multiple tiny 2 mm and smaller bone densities within the trochlea-olecranon empty joint space and just posterior to the proximal ulna on lateral view. There also appears to be an approximate 11 mm loose body abutting the trochlear notch of the proximal ulna on lateral view the elbow and lateral view of the forearm. Right forearm: No additional acute fracture is seen within the radius or ulna. Right hand: Minimal second through fifth DIP joint space narrowing. No acute fracture. IMPRESSION: 1. Posterior dislocation of the proximal ulna with respect to the distal humerus. 2. Acute, comminuted fracture of the volar aspect of the radial head with approximately 1.5 cm distal displacement of the small fracture fragments. 3. Multiple bone densities within the trochlea-olecranon empty joint space. Most of these measure 2 mm and smaller, however one loose body appears to measure approximately 11 mm. Electronically Signed   By: Neita Garnet M.D.   On: 12/10/2022 13:22   DG Forearm Right  Result Date: 12/10/2022 CLINICAL DATA:  Pain. EXAM: RIGHT ELBOW - COMPLETE 3+ VIEW; RIGHT FOREARM - 2 VIEW; RIGHT HAND - COMPLETE 3+ VIEW COMPARISON:  Right index finger radiographs 12/29/2019 FINDINGS: Right elbow: There is posterior dislocation of the proximal ulna with respect to the distal humerus. The distal humerus rests on the coronoid process on lateral view, and there is likely fracture of the tip of the coronoid process with distal extension of the small fracture fragments. There is also an acute, comminuted fracture of the volar aspect of the radial head with approximately 1.5 cm distal  displacement of the small fracture fragments, best seen on lateral view. There are multiple tiny 2 mm and smaller bone densities within the trochlea-olecranon empty joint space and just posterior to the proximal ulna on lateral view. There also appears to be an approximate 11 mm loose body abutting the trochlear notch of the proximal ulna on lateral view the elbow and lateral view of the forearm. Right forearm: No additional acute fracture is seen within the radius or ulna. Right hand: Minimal second through fifth DIP joint space narrowing. No acute fracture. IMPRESSION: 1. Posterior dislocation of the proximal ulna with respect to the distal humerus. 2. Acute, comminuted fracture of the volar aspect of the radial head with approximately 1.5 cm distal displacement of the small fracture fragments. 3. Multiple bone densities within the trochlea-olecranon empty joint space. Most of these measure 2 mm and smaller, however one loose body appears to measure approximately 11 mm. Electronically Signed   By: Neita Garnet M.D.   On: 12/10/2022 13:22   DG Hand Complete Right  Result Date: 12/10/2022 CLINICAL DATA:  Pain. EXAM: RIGHT ELBOW - COMPLETE 3+ VIEW; RIGHT FOREARM - 2 VIEW; RIGHT HAND - COMPLETE 3+ VIEW COMPARISON:  Right index finger radiographs 12/29/2019 FINDINGS: Right elbow: There is posterior dislocation of the proximal ulna with respect to the distal humerus. The distal humerus rests on the coronoid process on lateral view, and there is likely fracture of the tip of the coronoid process with distal extension of the small  fracture fragments. There is also an acute, comminuted fracture of the volar aspect of the radial head with approximately 1.5 cm distal displacement of the small fracture fragments, best seen on lateral view. There are multiple tiny 2 mm and smaller bone densities within the trochlea-olecranon empty joint space and just posterior to the proximal ulna on lateral view. There also appears to  be an approximate 11 mm loose body abutting the trochlear notch of the proximal ulna on lateral view the elbow and lateral view of the forearm. Right forearm: No additional acute fracture is seen within the radius or ulna. Right hand: Minimal second through fifth DIP joint space narrowing. No acute fracture. IMPRESSION: 1. Posterior dislocation of the proximal ulna with respect to the distal humerus. 2. Acute, comminuted fracture of the volar aspect of the radial head with approximately 1.5 cm distal displacement of the small fracture fragments. 3. Multiple bone densities within the trochlea-olecranon empty joint space. Most of these measure 2 mm and smaller, however one loose body appears to measure approximately 11 mm. Electronically Signed   By: Neita Garnet M.D.   On: 12/10/2022 13:22    Disposition: Discharge disposition: 01-Home or Self Care       Discharge Instructions     Diet - low sodium heart healthy   Complete by: As directed    Diet - low sodium heart healthy   Complete by: As directed    Increase activity slowly   Complete by: As directed    NWB RUE, keep dressing clean dry and intact   Increase activity slowly   Complete by: As directed    NWB RUE, keep dressing clean dry and intact, keep splint on          Signed: Luci Bank 12/23/2022, 12:37 PM

## 2022-12-24 NOTE — Op Note (Signed)
.Orthopaedic Surgery Operative Note (CSN: [id number])  Willie Cox  [birth year] Date of Surgery: 12/19/2022   Diagnoses:  RIGHT ELBOW DISLOCATION, RIGHT RADIAL HEAD FRACTURE, RIGHT CORONOID FRACTURE, RIGHT LATERAL ULNAR COLLATERAL LIGAMENT TEAR, RIGHT ELBOW INTRA-ARTICULAR LOOSE BODIES  Procedure: RIGHT ELBOW RADIAL HEAD ARTHROPLASTY, OPEN REDUCTION INTERNAL FIXATION RIGHT CORONOID FRACTURE, RIGHT ELBOW LATERAL ULNAR COLLATERAL LIGAMENT REPAIR, RIGHT ELBOW REMOVAL OF LOOSE BODIES   Operative Finding Successful completion of the planned procedure.  There was a comminuted radial head fracture not amenable to fixation and was decided to proceed with arthroplasty. There were several fracture fragments of radial head and coronoid within the ulnohumeral joint that were removed. The coronoid tip fracture was fixed with suture through the ulna.  Post-operative plan: The patient will be NWB RUE.  The patient will be in a splint for 7-10 days and then start physical therapy for ROM.   Pain control with PRN oral pain medication.  Follow up plan will be scheduled in approximately 7-10 days for incision check and XR right elbow.  Post-Op Diagnosis: Same Surgeons:Primary: Luci Bank, MD Co-Surgeon: Jones Broom, MD Assistants: Danielle Rankin, OPA-C  Location: [facility] ROOM 09 Anesthesia: General with regional anesthesia placed post-operatively Antibiotics: Ancef 2 g Tourniquet time:  Total Tourniquet Time Documented: Upper Arm (Right) - 121 minutes Total: Upper Arm (Right) - 121 minutes Estimated Blood Loss: 100 cc Complications: None Specimens: None Implants: Implant Name Type Inv. Item Serial No. Manufacturer Lot No. LRB No. Used Action  STEM Mellody Drown - [id number] Stem STEM Mellody Drown  [employer](ORTH,TRAU,BIO,SG) [id number] Right 1 Implanted  IMPLANT HEAD - [id number] Orthopedic Implant IMPLANT HEAD  [employer](ORTH,TRAU,BIO,SG) [id number] Right 1 Implanted   [facility] SUT 1 SHRT SM RGD INSRTR - [id number] Anchor ANCH SUT 1 SHRT SM RGD INSRTR  [employer]ON(ORTH,TRAU,BIO,SG) [id number] Right 1 Implanted    Indications for Surgery:   [name] is a 47 y.o. male with a right elbow dislocation with radial head fracture, coronoid tip fracture representing a terrible triad injury. He sustained the injury after he fell off a ladder. He was reduced and splinted in the ED and referred to follow up for surgical management.  Benefits and risks of operative and nonoperative management were discussed prior to surgery with patient/guardian(s) and informed consent form was completed.  Specific risks including bleeding, infection,damage to neurovascular structures, wrist drop stiffness, need for additional surgery, and cardio pulmonary complications from anesthesia   Procedure:   The patient was identified properly. Informed consent was obtained and the surgical site was marked. The patient was taken up to suite where general anesthesia was induced.  The patient was positioned supine with his right arm extended onto a hand table.  The right arm was prepped and draped in the usual sterile fashion.  Timeout was performed before the beginning of the case.  A sterile tourniquet placed and was used for the above duration  .We began with a longitudinal approach to the lateral elbow through Kaplan's interval.  A 7 cm incision was made through the skin we achieved hemostasis to be progressed.  We identified the fascia and it was noted there was a traumatic rent in the fascia that was further split at Kaplan's interval  We were able to identify the joint immediately and saw fracture hematoma which was evacuated.  The lateral ligaments were obviously partially avulsed from the lateral epicondyle leaving a bare area.  We released these further to aid in visualization as  we were going to clearly have to repair them as they were.  This point multiple radial head  fragments and loose bodies were removed from the elbow.  All the fragments were sized on the back table.  Patient's radial head measured and similar size implant was selected . We freshened our radial neck cut and used a planer to make it flat.  This was broached up to a size 7 stem. At that point the [employer]omet radial head arthroplasty system was used to trial the initial implants. Sizing looked appropriate on fluoroscopic imaging and the trials were removed.  We then turned our attention to the coronoid fracture. A #2 fiberwire was used to secure the fracture fragment and attached anterior capsule. Two percutaneous transosseous drill tunnels were made through the proximal ulna. The limbs of the fiber wire were shuttled through the tunnels and tied over a bone bridge on the dorsal ulna. At that point the final implant of the radial head arthroplasty were implanted.  This point we performed a lateral ligament repair.  We were able to place a juggernaut suture anchor in the center rotation of the lateral epicondyle and use a locking Krakw type stitch with 1 limb through the lateral ligaments and use the other limb as opposed to repair the ligaments.  Were happy with her final repair.    We irrigated the wound copiously.  The split in the fascia was closed longitudinally with Vicryl. The skin was closed with interrupted 3-0 vicryl and skin staples. A sterile dressing was applied and a long arm splint with the elbow at 90 degrees flexion was placed. Patient was awoken taken to PACU in stable condition.  Danielle Rankin, OPA-C was present and scrubbed throughout the case, critical for completion in a timely fashion, and for retraction, instrumentation, closure.  Luci Bank 12/24/2022, 8:20 PM Orthopaedic Surgery

## 2023-01-02 NOTE — Plan of Care (Signed)
[facility] Tonsillectomy/Adenoidectomy, Postoperative PEDS care plan entered in error.

## 2023-01-06 NOTE — Anesthesia Postprocedure Evaluation (Signed)
Anesthesia Post Note  Patient: Willie Cox  Procedure(s) Performed: RIGHT RADIAL HEAD ARTHROPLASTY, RIGHT CORONOID OPEN REDUCATION INTERNAL FIXATION, RIGHT LATERAL ULNAR COLLATERAL LIGAMENT REPAIR, REMOVAL OF LOOSE BONE (Right: Elbow) REMOVAL LOOSE BODIES (Right)     Patient location during evaluation: PACU Anesthesia Type: General and Regional Level of consciousness: awake Pain management: pain level controlled Vital Signs Assessment: post-procedure vital signs reviewed and stable Respiratory status: spontaneous breathing, nonlabored ventilation and respiratory function stable Cardiovascular status: blood pressure returned to baseline and stable Postop Assessment: no apparent nausea or vomiting Anesthetic complications: no   No notable events documented.  Last Vitals:  Vitals:   12/20/22 0107 12/20/22 0524  BP: 123/79 112/71  Pulse: (!) 102 93  Resp: 17 17  Temp: 37.1 C 37.1 C  SpO2: 96% 96%    Last Pain:  Vitals:   12/20/22 0812  TempSrc:   PainSc: 0-No pain   Pain Goal: Patients Stated Pain Goal: 0 (12/20/22 0128)                 Catheryn Bacon Kynli Chou

## 2023-01-10 ENCOUNTER — Other Ambulatory Visit: Payer: Self-pay

## 2023-01-10 ENCOUNTER — Encounter: Payer: Self-pay | Admitting: Physical Therapy

## 2023-01-10 ENCOUNTER — Ambulatory Visit: Payer: Self-pay | Attending: Orthopedic Surgery | Admitting: Physical Therapy

## 2023-01-10 DIAGNOSIS — M6281 Muscle weakness (generalized): Secondary | ICD-10-CM

## 2023-01-10 DIAGNOSIS — M25521 Pain in right elbow: Secondary | ICD-10-CM

## 2023-01-10 DIAGNOSIS — M25621 Stiffness of right elbow, not elsewhere classified: Secondary | ICD-10-CM

## 2023-01-10 NOTE — Therapy (Signed)
OUTPATIENT PHYSICAL THERAPY EVALUATION   Patient Name: Willie Cox MRN: [id number] DOB:1975-06-03, 47 y.o., male Today's Date: 01/10/2023  END OF SESSION:  PT End of Session - 01/10/23 1359     Visit Number 1    Number of Visits 17    Date for PT Re-Evaluation 03/07/23    Authorization Type none    PT Start Time 1400   late check in   PT Stop Time 1451    PT Time Calculation (min) 51 min    Activity Tolerance No increased pain;Patient limited by pain    Behavior During Therapy [facility] for tasks assessed/performed             History reviewed. No pertinent past medical history. Past Surgical History:  Procedure Laterality Date   FOREIGN BODY REMOVAL Right 12/19/2022   Procedure: REMOVAL LOOSE BODIES;  Surgeon: Luci Bank, MD;  Location: [facility];  Service: Orthopedics;  Laterality: Right;   ORIF ELBOW FRACTURE Right 12/19/2022   Procedure: RIGHT RADIAL HEAD ARTHROPLASTY, RIGHT CORONOID OPEN REDUCATION INTERNAL FIXATION, RIGHT LATERAL ULNAR COLLATERAL LIGAMENT REPAIR, REMOVAL OF LOOSE BONE;  Surgeon: Luci Bank, MD;  Location: [facility];  Service: Orthopedics;  Laterality: Right;   Patient Active Problem List   Diagnosis Date Noted   History of arthroplasty of right elbow 12/19/2022   Closed dislocation of right elbow 12/19/2022    PCP: no PCP in chart  REFERRING PROVIDER: Luci Bank, MD  REFERRING DIAG: Status Post Right Elbow Radial Head Arthroplasty  THERAPY DIAG:  Pain in right elbow  Muscle weakness (generalized)  Stiffness of right elbow, not elsewhere classified  Rationale for Evaluation and Treatment: Rehabilitation  ONSET DATE: DOS 12/19/22 (right radial head arthroplasty, ORIF R coronoid fracture, R LUCL repair)  SUBJECTIVE:                                                                                                                                                                                      SUBJECTIVE  STATEMENT: Accompanied by his daughter who assists with translation per pt request, politely declines video interpreter. Pt arrives with hinge brace. States he fell from roof while at work and injured his arm, went to hospital. States this occurred on 12/10/22, got scheduled for surgery on 10th. States he followed up with surgeon Wednesday - states they sent him for therapy as he was pretty stiff, and he notes significant increase in pain over past few days. States he was told he can take his brace off at home but wear it when he is out and doing activity. Had staples taken out Wednesday, states he was told incision  doing well. Has ACE wrap on elbow and pt states he was told to keep it wrapped until next surgeon visit. Pt states he has a lot of pain since surgery, seems to be a little better but still pretty significant particularly when not taking medication. Has been using ice, 2-3x/day for ~66min at time. States since surgery he's getting help from family for dressing and daily tasks. Previously active for his work duties, heavy lifting. He also notes that since fall he has had headaches and multi joint pain that is consistent, denies any recent changes - states he has been communicating with providers about this and had extensive imaging (corroborated through EPIC review). Hand dominance: Right  PERTINENT HISTORY: Radial head arthroplasty 12/19/22 Pt denies issues with heart, blood pressure, hx cancer   PAIN:  Are you having pain: 7/10 Location/description: R elbow and shoulder, wrist - aggravating factors: movement, constant - Easing factors: medication    PRECAUTIONS: Elbow surgery; radial head replacement RUE Per physician referral: PROM/AAROM, NWB   WEIGHT BEARING RESTRICTIONS: Yes NWB RUE  FALLS:  Has patient fallen in last 6 months? Yes. Number of falls 1 fall, precipitating episode. 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  DIAGNOSTIC FINDINGS:  DOS 12/19/22 R radial head arthroplasty Extensive imaging work up 12/10/22; please refer to [facility] for details. Of note, reassuring CT head, maxillofacial, and cervical spine  PATIENT SURVEYS:  FOTO deferred given time constraints  COGNITION: Overall cognitive status: Within functional limits for tasks assessed     SENSATION: Unable to assess due to wrapping - pt states he does have some tingling in lateral elbow. Noted swelling in fingers WNL for immobilization, no redness  POSTURE: Elevated R UT, R elbow in brace and ACE wrap  UPPER EXTREMITY ROM:   ROM Right eval Left eval  Shoulder flexion    Shoulder extension    Shoulder abduction    Shoulder adduction    Shoulder internal rotation    Shoulder external rotation    Elbow flexion P: 90 deg A: 136 deg  Elbow extension P: lacking 60   A: full   Wrist flexion    Wrist extension    Wrist ulnar deviation    Wrist radial deviation    Wrist pronation    Wrist supination    (Blank rows = not tested) (Key: WFL = within functional limits not formally assessed, * = concordant pain, s = stiffness/stretching sensation, NT = not tested)  Comments:   UPPER EXTREMITY MMT:  MMT Right eval Left eval  Shoulder flexion    Shoulder extension    Shoulder abduction    Shoulder adduction    Shoulder internal rotation    Shoulder external rotation    Middle trapezius    Lower trapezius    Elbow flexion    Elbow extension    Wrist flexion    Wrist extension    Wrist ulnar deviation    Wrist radial deviation    Wrist pronation    Wrist supination    Grip  strength (lbs)    (Blank  rows = not tested) (Key: WFL = within functional limits not formally assessed, * = concordant pain, s = stiffness/stretching sensation, NT = not tested)  Comment: deferred on eval given acuity of surgery   JOINT MOBILITY TESTING:  Deferred given acuity of surgery  PALPATION:  Deferred given ACE wrap and brace   TODAY'S TREATMENT:                                                                                                                                         [facility] Adult PT Treatment:                                                DATE: 01/10/23 Therapeutic Exercise: AAROM R elbow ext/flex w/ contralateral UE, seated; education on appropriate performance/setup, monitoring symptoms and modifying accordingly, and appropriate positioning. Handout provided in spanish    PATIENT EDUCATION: Education details: Pt education on PT impairments, prognosis, and POC. Informed consent. Rationale for interventions, safe/appropriate HEP performance Person educated: Patient and daughter Education method: Explanation, Demonstration, Tactile cues, Verbal cues, and Handouts Education comprehension: verbalized understanding, returned demonstration, verbal cues required, tactile cues required, and needs further education    HOME EXERCISE PROGRAM: Access Code: [email or web address] URL: [email or web address] Date: 01/10/2023 Prepared by: Fransisco Hertz  Exercises - Seated Elbow Flexion AAROM  - 2-3 x daily - 1 sets - 8 reps (01/10/23 educated pt and daughter on elbow flex/ext AAROM, appropriate performance)  ASSESSMENT:  CLINICAL IMPRESSION: Patient is a 47 y.o. gentleman who was seen today for physical therapy evaluation and treatment for radial head arthroplasty DOS 12/19/22. Pt arrives w/ hinge brace, endorses high pain levels since surgery and requiring assist for basic daily activities. Per physician referral, okay for PROM/AAROM of elbow at this time; subsequently  limited exam, although pt demonstrates significant limitations with elbow flexion and extension. Pt does well with HEP practice, emphasis on monitoring closely and appropriate positioning for comfort. Pt endorses fairly significant pain on arrival and continues throughout session but does not endorse any overt increase with today's activity. No adverse events, session is limited by late check in and increased time with subjective. Recommend skilled PT to address aforementioned deficits with aim of improving functional tolerance and reducing pain with typical activities. Pt departs today's session in no acute distress, all voiced concerns/questions addressed appropriately from PT perspective.    OBJECTIVE IMPAIRMENTS: decreased activity tolerance, decreased endurance, decreased mobility, decreased ROM, decreased strength, impaired flexibility, impaired UE functional use, postural dysfunction, and pain.   ACTIVITY LIMITATIONS: carrying, lifting, sleeping, bathing, toileting, dressing, self feeding, reach over head, and hygiene/grooming  PARTICIPATION LIMITATIONS: meal prep, cleaning, laundry, driving, community activity, and occupation  PERSONAL FACTORS: Time since onset of injury/illness/exacerbation are also affecting patient's functional outcome.  REHAB POTENTIAL: Good  CLINICAL DECISION MAKING: Stable/uncomplicated  EVALUATION COMPLEXITY: Low   GOALS: Goals reviewed with patient? No given time constraints, did discuss role of PT and PT POC  SHORT TERM GOALS: Target date: 02/07/2023 Pt will demonstrate appropriate understanding and performance of initially prescribed HEP in order to facilitate improved independence with management of symptoms.  Baseline: HEP provided on eval Goal status: INITIAL   2. Pt will improve at least MCID on FOTO in order to demonstrate improved perception of function due to symptoms.  Baseline: FOTO TBD  Goal status: INITIAL    LONG TERM GOALS: Target date:  03/07/2023  Pt will meet predicted score on FOTO in order to demonstrate improved perception of function due to symptoms. Baseline: FOTO TBD Goal status: INITIAL  2.  Pt will demonstrate at least 10-120 degrees of active elbow ROM in order to demonstrate improved tolerance to daily tasks. Baseline: see ROM chart above - PROM only on eval Goal status: INITIAL  3.  Pt will demonstrate grip strength that is at least 10# from symmetrical in order to demonstrate improved functional strength. Baseline: NT on eval given proximity to surgery Goal status: INITIAL  4. Pt will report ability to perform typical ADLs/self care independently with less than 3 point increase in pain on NPS.  Baseline: receiving assist from family per pt report  Goal status: INITIAL   5. Pt will be able to lift up to 30# for 5 repetitions with less than 3 pt increase in pain in order to facilitate improved functional tolerance.  Baseline: unable d/t post op precautions  Goal status: INITIAL    PLAN:  PT FREQUENCY: 1-2x/week  PT DURATION: 8 weeks  PLANNED INTERVENTIONS: 97164- PT Re-evaluation, 97110-Therapeutic exercises, 97530- Therapeutic activity, 97112- Neuromuscular re-education, 97535- Self Care, 40981- Manual therapy, [phone]- Gait training, Patient/Family education, Balance training, Stair training, Taping, Joint mobilization, Scar mobilization, Cryotherapy, and Moist heat  PLAN FOR NEXT SESSION: Review/update HEP PRN. PROM/AAROM to elbow. Wrist AAROM. Given L UCL repair also avoid valgus/varus stresses. Symptom modification strategies as indicated/appropriate.    Ashley Murrain PT, DPT 01/10/2023 3:32 PM

## 2023-01-16 ENCOUNTER — Other Ambulatory Visit: Payer: Self-pay

## 2023-01-16 ENCOUNTER — Encounter: Payer: Self-pay | Admitting: Physical Therapy

## 2023-01-16 ENCOUNTER — Ambulatory Visit: Payer: Self-pay

## 2023-01-16 ENCOUNTER — Ambulatory Visit: Payer: Self-pay | Admitting: Physical Therapy

## 2023-01-16 DIAGNOSIS — M6281 Muscle weakness (generalized): Secondary | ICD-10-CM

## 2023-01-16 DIAGNOSIS — M25521 Pain in right elbow: Secondary | ICD-10-CM

## 2023-01-16 DIAGNOSIS — M25621 Stiffness of right elbow, not elsewhere classified: Secondary | ICD-10-CM

## 2023-01-16 NOTE — Therapy (Signed)
OUTPATIENT PHYSICAL THERAPY TREATMENT NOTE   Patient Name: Willie Cox MRN: [id number] DOB:1975/08/22, 47 y.o., male Today's Date: 01/16/2023   END OF SESSION:  PT End of Session - 01/16/23 1622     Visit Number 2    Number of Visits 17    Date for PT Re-Evaluation 03/07/23    Authorization Type Self Pay    PT Start Time 1615    PT Stop Time 1700    PT Time Calculation (min) 45 min    Activity Tolerance Patient limited by pain    Behavior During Therapy [facility] for tasks assessed/performed              History reviewed. No pertinent past medical history. Past Surgical History:  Procedure Laterality Date   FOREIGN BODY REMOVAL Right 12/19/2022   Procedure: REMOVAL LOOSE BODIES;  Surgeon: Luci Bank, MD;  Location: [facility];  Service: Orthopedics;  Laterality: Right;   ORIF ELBOW FRACTURE Right 12/19/2022   Procedure: RIGHT RADIAL HEAD ARTHROPLASTY, RIGHT CORONOID OPEN REDUCATION INTERNAL FIXATION, RIGHT LATERAL ULNAR COLLATERAL LIGAMENT REPAIR, REMOVAL OF LOOSE BONE;  Surgeon: Luci Bank, MD;  Location: [facility];  Service: Orthopedics;  Laterality: Right;   Patient Active Problem List   Diagnosis Date Noted   History of arthroplasty of right elbow 12/19/2022   Closed dislocation of right elbow 12/19/2022    PCP: no PCP in chart  REFERRING PROVIDER: Luci Bank, MD  REFERRING DIAG: Status Post Right Elbow Radial Head Arthroplasty  THERAPY DIAG:  Pain in right elbow  Muscle weakness (generalized)  Stiffness of right elbow, not elsewhere classified  Rationale for Evaluation and Treatment: Rehabilitation  ONSET DATE: DOS 12/19/22 (right radial head arthroplasty, ORIF R coronoid fracture, R LUCL repair)  SUBJECTIVE:                                                                                                                                                                                      SUBJECTIVE STATEMENT: Daughter acts as  interpreter Patient reports still having pain in the elbow.   EVAL: Pt arrives with hinge brace. States he fell from roof while at work and injured his arm, went to hospital. States this occurred on 12/10/22, got scheduled for surgery on 10th. States he followed up with surgeon Wednesday - states they sent him for therapy as he was pretty stiff, and he notes significant increase in pain over past few days. States he was told he can take his brace off at home but wear it when he is out and doing activity. Had staples taken out Wednesday, states he was told incision doing well.  Has ACE wrap on elbow and pt states he was told to keep it wrapped until next surgeon visit. Pt states he has a lot of pain since surgery, seems to be a little better but still pretty significant particularly when not taking medication. Has been using ice, 2-3x/day for ~32min at time. States since surgery he's getting help from family for dressing and daily tasks. Previously active for his work duties, heavy lifting. He also notes that since fall he has had headaches and multi joint pain that is consistent, denies any recent changes - states he has been communicating with providers about this and had extensive imaging (corroborated through EPIC review). Hand dominance: Right  PERTINENT HISTORY: Radial head arthroplasty 12/19/22 Pt denies issues with heart, blood pressure, hx cancer   PAIN:  Are you having pain: 6-7/10 Location/description: R elbow and shoulder, wrist - aggravating factors: movement, constant - Easing factors: medication    PRECAUTIONS: Elbow surgery; radial head replacement RUE Per physician referral: PROM/AAROM, NWB   WEIGHT BEARING RESTRICTIONS: Yes NWB RUE  FALLS:  Has patient fallen in last 6 months? Yes. Number of falls 1 fall, precipitating episode. 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  DIAGNOSTIC FINDINGS:  DOS 12/19/22 R radial head arthroplasty Extensive imaging work up 12/10/22; please refer to [facility] for details. Of note, reassuring CT head, maxillofacial, and cervical spine  PATIENT SURVEYS:  FOTO deferred given time constraints 01/16/23 4%  COGNITION: Overall cognitive status: Within functional limits for tasks assessed     SENSATION: Unable to assess due to wrapping - pt states he does have some tingling in lateral elbow. Noted swelling in fingers WNL for immobilization, no redness  POSTURE: Elevated R UT, R elbow in brace and ACE wrap  UPPER EXTREMITY ROM:   ROM Right eval Left eval Right 01/16/23  Shoulder flexion     Shoulder extension     Shoulder abduction     Shoulder adduction     Shoulder internal rotation     Shoulder external rotation     Elbow flexion P: 90 deg A: 136 deg P: 105  Elbow extension P: lacking 60   A: full  P: lacking 40  Wrist flexion     Wrist extension     Wrist ulnar deviation     Wrist radial deviation     Wrist pronation     Wrist supination     (Blank rows = not tested) (Key: WFL = within functional limits not formally assessed, * = concordant pain, s = stiffness/stretching sensation, NT = not tested)  Comments:   UPPER EXTREMITY MMT:  MMT Right eval Left eval  Shoulder flexion    Shoulder extension    Shoulder abduction    Shoulder adduction    Shoulder internal rotation    Shoulder external rotation    Middle trapezius    Lower trapezius    Elbow flexion    Elbow extension    Wrist flexion    Wrist extension    Wrist ulnar deviation    Wrist radial deviation  Wrist pronation     Wrist supination    Grip strength (lbs)    (Blank rows = not tested) (Key: WFL = within functional limits not formally assessed, * = concordant pain, s = stiffness/stretching sensation, NT = not tested)  Comment: deferred on eval given acuity of surgery   JOINT MOBILITY TESTING:  Deferred given acuity of surgery  PALPATION:  Deferred given ACE wrap and brace    TODAY'S TREATMENT:          [facility] Adult PT Treatment:                                                DATE: 01/16/23 Therapeutic Exercise: AAROM Rt elbow flex/ext using Lt UE for assist AAROM Rt wrist flex/ext using Lt UE for assist Seated scapular retraction  Active gripping  Manual Therapy: PROM right elbow/wrist flex/ext and shoulder flexion STM right bicep Therapeutic Activity: FOTO assessment                                                                                                                                  [facility] Adult PT Treatment:                                                DATE: 01/10/23 Therapeutic Exercise: AAROM R elbow ext/flex w/ contralateral UE, seated; education on appropriate performance/setup, monitoring symptoms and modifying accordingly, and appropriate positioning. Handout provided in spanish    PATIENT EDUCATION: Education details: HEP, instructed on properly wrapping right elbow with ACE wrap Person educated: Patient and daughter Education method: Explanation, Demonstration, Tactile cues, Verbal cues Education comprehension: verbalized understanding, returned demonstration, verbal cues required, tactile cues required, and needs further education    HOME EXERCISE PROGRAM: Access Code: [email or web address]  ASSESSMENT:  CLINICAL IMPRESSION: Patient with fair tolerance for their due to elbow pain, no adverse effects reported. Therapy focused on right elbow PROM and AAROM primarily with elbow flexion and extension. Performed STM to right bicep which helped to improve elbow extension motion. His  elbow motion has improved since initial eval. Instructed patient and daughter and properly wrapping his elbow and provided a new ACE wrap. No changes to HEP. Patient would benefit from continued skilled PT to progress his elbow mobility and strength in order to reduce pain and maximize functional ability.   EVAL: Patient is a 47 y.o. gentleman who was seen today for physical therapy evaluation and treatment for radial head arthroplasty DOS 12/19/22. Pt arrives w/ hinge brace, endorses high pain levels since surgery and requiring assist for basic daily activities. Per physician referral, okay for PROM/AAROM of elbow at this time; subsequently limited exam, although pt  demonstrates significant limitations with elbow flexion and extension. Pt does well with HEP practice, emphasis on monitoring closely and appropriate positioning for comfort. Pt endorses fairly significant pain on arrival and continues throughout session but does not endorse any overt increase with today's activity. No adverse events, session is limited by late check in and increased time with subjective. Recommend skilled PT to address aforementioned deficits with aim of improving functional tolerance and reducing pain with typical activities. Pt departs today's session in no acute distress, all voiced concerns/questions addressed appropriately from PT perspective.    OBJECTIVE IMPAIRMENTS: decreased activity tolerance, decreased endurance, decreased mobility, decreased ROM, decreased strength, impaired flexibility, impaired UE functional use, postural dysfunction, and pain.   ACTIVITY LIMITATIONS: carrying, lifting, sleeping, bathing, toileting, dressing, self feeding, reach over head, and hygiene/grooming  PARTICIPATION LIMITATIONS: meal prep, cleaning, laundry, driving, community activity, and occupation  PERSONAL FACTORS: Time since onset of injury/illness/exacerbation are also affecting patient's functional outcome.    GOALS: Goals  reviewed with patient? No given time constraints, did discuss role of PT and PT POC  SHORT TERM GOALS: Target date: 02/07/2023 Pt will demonstrate appropriate understanding and performance of initially prescribed HEP in order to facilitate improved independence with management of symptoms.  Baseline: HEP provided on eval Goal status: INITIAL   2. Pt will improve at least MCID on FOTO in order to demonstrate improved perception of function due to symptoms.  Baseline: FOTO TBD  Goal status: INITIAL    LONG TERM GOALS: Target date: 03/07/2023  Pt will meet predicted score on FOTO in order to demonstrate improved perception of function due to symptoms. Baseline: FOTO TBD 01/16/2023: 4% initial, predicted 52% Goal status: INITIAL  2.  Pt will demonstrate at least 10-120 degrees of active elbow ROM in order to demonstrate improved tolerance to daily tasks. Baseline: see ROM chart above - PROM only on eval Goal status: INITIAL  3.  Pt will demonstrate grip strength that is at least 10# from symmetrical in order to demonstrate improved functional strength. Baseline: NT on eval given proximity to surgery Goal status: INITIAL  4. Pt will report ability to perform typical ADLs/self care independently with less than 3 point increase in pain on NPS.  Baseline: receiving assist from family per pt report  Goal status: INITIAL   5. Pt will be able to lift up to 30# for 5 repetitions with less than 3 pt increase in pain in order to facilitate improved functional tolerance.  Baseline: unable d/t post op precautions  Goal status: INITIAL    PLAN: PT FREQUENCY: 1-2x/week  PT DURATION: 8 weeks  PLANNED INTERVENTIONS: 97164- PT Re-evaluation, 97110-Therapeutic exercises, 97530- Therapeutic activity, 97112- Neuromuscular re-education, 97535- Self Care, 09811- Manual therapy, [phone]- Gait training, Patient/Family education, Balance training, Stair training, Taping, Joint mobilization, Scar mobilization,  Cryotherapy, and Moist heat  PLAN FOR NEXT SESSION: Review/update HEP PRN. PROM/AAROM to elbow. Wrist AAROM. Given L UCL repair also avoid valgus/varus stresses. Symptom modification strategies as indicated/appropriate.    Rosana Hoes, PT, DPT, LAT, ATC 01/16/23  5:05 PM Phone: [phone] Fax: [phone]

## 2023-01-22 ENCOUNTER — Ambulatory Visit: Payer: Self-pay

## 2023-01-22 DIAGNOSIS — M25521 Pain in right elbow: Secondary | ICD-10-CM

## 2023-01-22 DIAGNOSIS — M6281 Muscle weakness (generalized): Secondary | ICD-10-CM

## 2023-01-22 DIAGNOSIS — M25621 Stiffness of right elbow, not elsewhere classified: Secondary | ICD-10-CM

## 2023-01-22 NOTE — Therapy (Signed)
OUTPATIENT PHYSICAL THERAPY TREATMENT NOTE   Patient Name: Willie Cox MRN: [id number] DOB:[birth year], 47 y.o., male Today's Date: 01/22/2023   END OF SESSION:  PT End of Session - 01/22/23 1535     Visit Number 3    Number of Visits 17    Date for PT Re-Evaluation 03/07/23    Authorization Type Self Pay    PT Start Time 1534    PT Stop Time 1613    PT Time Calculation (min) 39 min    Activity Tolerance Patient limited by pain    Behavior During Therapy [facility] for tasks assessed/performed             History reviewed. No pertinent past medical history. Past Surgical History:  Procedure Laterality Date   FOREIGN BODY REMOVAL Right 12/19/2022   Procedure: REMOVAL LOOSE BODIES;  Surgeon: Luci Bank, MD;  Location: [facility];  Service: Orthopedics;  Laterality: Right;   ORIF ELBOW FRACTURE Right 12/19/2022   Procedure: RIGHT RADIAL HEAD ARTHROPLASTY, RIGHT CORONOID OPEN REDUCATION INTERNAL FIXATION, RIGHT LATERAL ULNAR COLLATERAL LIGAMENT REPAIR, REMOVAL OF LOOSE BONE;  Surgeon: Luci Bank, MD;  Location: [facility];  Service: Orthopedics;  Laterality: Right;   Patient Active Problem List   Diagnosis Date Noted   History of arthroplasty of right elbow 12/19/2022   Closed dislocation of right elbow 12/19/2022    PCP: no PCP in chart  REFERRING PROVIDER: Luci Bank, MD  REFERRING DIAG: Status Post Right Elbow Radial Head Arthroplasty  THERAPY DIAG:  Pain in right elbow  Muscle weakness (generalized)  Stiffness of right elbow, not elsewhere classified  Rationale for Evaluation and Treatment: Rehabilitation  ONSET DATE: DOS 12/19/22 (right radial head arthroplasty, ORIF R coronoid fracture, R LUCL repair)  SUBJECTIVE:                                                                                                                                                                                      SUBJECTIVE STATEMENT: Daughter acts as  interpreter Patient reports still having pain in the elbow that goes to his his fingers and wrist. He has been compliant with his HEP. He reports that he has also had numbness in the arm/elbow as well as more nauseous.   EVAL: Pt arrives with hinge brace. States he fell from roof while at work and injured his arm, went to hospital. States this occurred on 12/10/22, got scheduled for surgery on 10th. States he followed up with surgeon Wednesday - states they sent him for therapy as he was pretty stiff, and he notes significant increase in pain over past few days. States he was told  he can take his brace off at home but wear it when he is out and doing activity. Had staples taken out Wednesday, states he was told incision doing well. Has ACE wrap on elbow and pt states he was told to keep it wrapped until next surgeon visit. Pt states he has a lot of pain since surgery, seems to be a little better but still pretty significant particularly when not taking medication. Has been using ice, 2-3x/day for ~56min at time. States since surgery he's getting help from family for dressing and daily tasks. Previously active for his work duties, heavy lifting. He also notes that since fall he has had headaches and multi joint pain that is consistent, denies any recent changes - states he has been communicating with providers about this and had extensive imaging (corroborated through EPIC review). Hand dominance: Right  PERTINENT HISTORY: Radial head arthroplasty 12/19/22 Pt denies issues with heart, blood pressure, hx cancer   PAIN:  Are you having pain: 6-7/10 Location/description: R elbow and shoulder, wrist - aggravating factors: movement, constant - Easing factors: medication    PRECAUTIONS: Elbow surgery; radial head replacement RUE Per physician referral: PROM/AAROM, NWB   WEIGHT BEARING RESTRICTIONS: Yes NWB RUE  FALLS:  Has patient fallen in last 6 months? Yes. Number of falls 1 fall, precipitating  episode. 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  DIAGNOSTIC FINDINGS:  DOS 12/19/22 R radial head arthroplasty Extensive imaging work up 12/10/22; please refer to [facility] for details. Of note, reassuring CT head, maxillofacial, and cervical spine  PATIENT SURVEYS:  FOTO deferred given time constraints 01/16/23 4%  COGNITION: Overall cognitive status: Within functional limits for tasks assessed     SENSATION: Unable to assess due to wrapping - pt states he does have some tingling in lateral elbow. Noted swelling in fingers WNL for immobilization, no redness  POSTURE: Elevated R UT, R elbow in brace and ACE wrap  UPPER EXTREMITY ROM:   ROM Right eval Left eval Right 01/16/23 Right 01/22/23  Shoulder flexion      Shoulder extension      Shoulder abduction      Shoulder adduction      Shoulder internal rotation      Shoulder external rotation      Elbow flexion P: 90 deg A: 136 deg P: 105 P: 105  Elbow extension P: lacking 60   A: full  P: lacking 40 P: lacking 40  Wrist flexion      Wrist extension      Wrist ulnar deviation      Wrist radial deviation      Wrist pronation      Wrist supination      (Blank rows = not tested) (Key: WFL = within functional limits not formally assessed, * = concordant pain, s = stiffness/stretching sensation, NT = not tested)  Comments:   UPPER EXTREMITY MMT:  MMT Right eval Left eval  Shoulder flexion    Shoulder extension    Shoulder abduction    Shoulder adduction    Shoulder internal rotation  Shoulder external  rotation    Middle trapezius    Lower trapezius    Elbow flexion    Elbow extension    Wrist flexion    Wrist extension    Wrist ulnar deviation    Wrist radial deviation    Wrist pronation    Wrist supination    Grip strength (lbs)    (Blank rows = not tested) (Key: WFL = within functional limits not formally assessed, * = concordant pain, s = stiffness/stretching sensation, NT = not tested)  Comment: deferred on eval given acuity of surgery   JOINT MOBILITY TESTING:  Deferred given acuity of surgery  PALPATION:  Deferred given ACE wrap and brace    TODAY'S TREATMENT:    [facility] Adult PT Treatment:                                                DATE: 01/22/23 Therapeutic Exercise: AAROM Rt elbow flex/ext using Lt UE for assist AROM Rt wrist flex/ext using Lt UE for assist Seated scapular retraction 2x10 Active gripping  Supination/pronation to neutral Manual Therapy: PROM right elbow/wrist flex/ext and shoulder flexion STM right bicep         [facility] Adult PT Treatment:                                                DATE: 01/16/23 Therapeutic Exercise: AAROM Rt elbow flex/ext using Lt UE for assist AAROM Rt wrist flex/ext using Lt UE for assist Seated scapular retraction  Active gripping  Manual Therapy: PROM right elbow/wrist flex/ext and shoulder flexion STM right bicep Therapeutic Activity: FOTO assessment                                                                                                                                  [facility] Adult PT Treatment:                                                DATE: 01/10/23 Therapeutic Exercise: AAROM R elbow ext/flex w/ contralateral UE, seated; education on appropriate performance/setup, monitoring symptoms and modifying accordingly, and appropriate positioning. Handout provided in spanish    PATIENT EDUCATION: Education details: HEP, instructed on properly wrapping right elbow with ACE wrap Person educated: Patient  and daughter Education method: Explanation, Demonstration, Tactile cues, Verbal cues Education comprehension: verbalized understanding, returned demonstration, verbal cues required, tactile cues required, and needs further education    HOME EXERCISE PROGRAM: Access Code: [email or web address]  ASSESSMENT:  CLINICAL IMPRESSION: Patient presents to PT  reporting continued pain in his elbow, wrist, hand, and fingers. He states the swelling has decreased minimally and that he feels like the exercises increase his pain and swelling. Encouraged patient to continue exercises at home as able to promote increasing ROM and decreasing swelling and pain.   EVAL: Patient is a 47 y.o. gentleman who was seen today for physical therapy evaluation and treatment for radial head arthroplasty DOS 12/19/22. Pt arrives w/ hinge brace, endorses high pain levels since surgery and requiring assist for basic daily activities. Per physician referral, okay for PROM/AAROM of elbow at this time; subsequently limited exam, although pt demonstrates significant limitations with elbow flexion and extension. Pt does well with HEP practice, emphasis on monitoring closely and appropriate positioning for comfort. Pt endorses fairly significant pain on arrival and continues throughout session but does not endorse any overt increase with today's activity. No adverse events, session is limited by late check in and increased time with subjective. Recommend skilled PT to address aforementioned deficits with aim of improving functional tolerance and reducing pain with typical activities. Pt departs today's session in no acute distress, all voiced concerns/questions addressed appropriately from PT perspective.    OBJECTIVE IMPAIRMENTS: decreased activity tolerance, decreased endurance, decreased mobility, decreased ROM, decreased strength, impaired flexibility, impaired UE functional use, postural dysfunction, and pain.   ACTIVITY LIMITATIONS: carrying,  lifting, sleeping, bathing, toileting, dressing, self feeding, reach over head, and hygiene/grooming  PARTICIPATION LIMITATIONS: meal prep, cleaning, laundry, driving, community activity, and occupation  PERSONAL FACTORS: Time since onset of injury/illness/exacerbation are also affecting patient's functional outcome.    GOALS: Goals reviewed with patient? No given time constraints, did discuss role of PT and PT POC  SHORT TERM GOALS: Target date: 02/07/2023 Pt will demonstrate appropriate understanding and performance of initially prescribed HEP in order to facilitate improved independence with management of symptoms.  Baseline: HEP provided on eval Goal status: INITIAL   2. Pt will improve at least MCID on FOTO in order to demonstrate improved perception of function due to symptoms.  Baseline: FOTO TBD  Goal status: INITIAL    LONG TERM GOALS: Target date: 03/07/2023  Pt will meet predicted score on FOTO in order to demonstrate improved perception of function due to symptoms. Baseline: FOTO TBD 01/16/2023: 4% initial, predicted 52% Goal status: INITIAL  2.  Pt will demonstrate at least 10-120 degrees of active elbow ROM in order to demonstrate improved tolerance to daily tasks. Baseline: see ROM chart above - PROM only on eval Goal status: INITIAL  3.  Pt will demonstrate grip strength that is at least 10# from symmetrical in order to demonstrate improved functional strength. Baseline: NT on eval given proximity to surgery Goal status: INITIAL  4. Pt will report ability to perform typical ADLs/self care independently with less than 3 point increase in pain on NPS.  Baseline: receiving assist from family per pt report  Goal status: INITIAL   5. Pt will be able to lift up to 30# for 5 repetitions with less than 3 pt increase in pain in order to facilitate improved functional tolerance.  Baseline: unable d/t post op precautions  Goal status: INITIAL    PLAN: PT FREQUENCY:  1-2x/week  PT DURATION: 8 weeks  PLANNED INTERVENTIONS: 97164- PT Re-evaluation, 97110-Therapeutic exercises, 97530- Therapeutic activity, 97112- Neuromuscular re-education, 97535- Self Care, 60454- Manual therapy, [phone]- Gait training, Patient/Family education, Balance training, Stair training, Taping, Joint mobilization, Scar mobilization, Cryotherapy, and Moist heat  PLAN FOR NEXT SESSION: Review/update HEP PRN.  PROM/AAROM to elbow. Wrist AAROM. Given L UCL repair also avoid valgus/varus stresses. Symptom modification strategies as indicated/appropriate.    Berta Minor PTA 01/22/23  4:28 PM Phone: [phone] Fax: [phone]

## 2023-01-27 NOTE — Therapy (Signed)
OUTPATIENT PHYSICAL THERAPY TREATMENT NOTE   Patient Name: Willie Cox MRN: [id number] DOB:Jul 22, 1975, 47 y.o., male Today's Date: 01/28/2023   END OF SESSION:  PT End of Session - 01/28/23 1615     Visit Number 4    Number of Visits 17    Date for PT Re-Evaluation 03/07/23    Authorization Type Self Pay    PT Start Time 1616    PT Stop Time 1701    PT Time Calculation (min) 45 min    Activity Tolerance Patient limited by pain;No increased pain              History reviewed. No pertinent past medical history. Past Surgical History:  Procedure Laterality Date   FOREIGN BODY REMOVAL Right 12/19/2022   Procedure: REMOVAL LOOSE BODIES;  Surgeon: Luci Bank, MD;  Location: [facility];  Service: Orthopedics;  Laterality: Right;   ORIF ELBOW FRACTURE Right 12/19/2022   Procedure: RIGHT RADIAL HEAD ARTHROPLASTY, RIGHT CORONOID OPEN REDUCATION INTERNAL FIXATION, RIGHT LATERAL ULNAR COLLATERAL LIGAMENT REPAIR, REMOVAL OF LOOSE BONE;  Surgeon: Luci Bank, MD;  Location: [facility];  Service: Orthopedics;  Laterality: Right;   Patient Active Problem List   Diagnosis Date Noted   History of arthroplasty of right elbow 12/19/2022   Closed dislocation of right elbow 12/19/2022    PCP: no PCP in chart  REFERRING PROVIDER: Luci Bank, MD  REFERRING DIAG: Status Post Right Elbow Radial Head Arthroplasty  THERAPY DIAG:  Pain in right elbow  Muscle weakness (generalized)  Stiffness of right elbow, not elsewhere classified  Rationale for Evaluation and Treatment: Rehabilitation  ONSET DATE: DOS 12/19/22 (right radial head arthroplasty, ORIF R coronoid fracture, R LUCL repair)  SUBJECTIVE:                                                                                                                                                                                      SUBJECTIVE STATEMENT: Pt arrives w/ daughter, appreciate assistance of in person  interpreter.  Pt states he saw MD last week, had increased pain afterwards. States he was told he could go without ace wrap but is using it for comfort. States he was told he could come out of brace at home and for sleeping. States he is doing HEP as he can, states he is doing 2-3x/day. States he tends to feel okay after PT sessions, still having pretty constant pain. His daughter notes his surgical limb looks better and seems to be moving better    EVAL: Pt arrives with hinge brace. States he fell from roof while at work and injured his arm, went to hospital.  States this occurred on 12/10/22, got scheduled for surgery on 10th. States he followed up with surgeon Wednesday - states they sent him for therapy as he was pretty stiff, and he notes significant increase in pain over past few days. States he was told he can take his brace off at home but wear it when he is out and doing activity. Had staples taken out Wednesday, states he was told incision doing well. Has ACE wrap on elbow and pt states he was told to keep it wrapped until next surgeon visit. Pt states he has a lot of pain since surgery, seems to be a little better but still pretty significant particularly when not taking medication. Has been using ice, 2-3x/day for ~24min at time. States since surgery he's getting help from family for dressing and daily tasks. Previously active for his work duties, heavy lifting. He also notes that since fall he has had headaches and multi joint pain that is consistent, denies any recent changes - states he has been communicating with providers about this and had extensive imaging (corroborated through EPIC review). Hand dominance: Right  PERTINENT HISTORY: Radial head arthroplasty 12/19/22 Pt denies issues with heart, blood pressure, hx cancer   PAIN:  Are you having pain: unrated 01/28/23 Location/description: R elbow and shoulder, wrist - aggravating factors: movement, constant - Easing factors:  medication    PRECAUTIONS: Elbow surgery; radial head replacement RUE Per physician referral: PROM/AAROM, NWB   WEIGHT BEARING RESTRICTIONS: Yes NWB RUE  FALLS:  Has patient fallen in last 6 months? Yes. Number of falls 1 fall, precipitating episode. 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  DIAGNOSTIC FINDINGS:  DOS 12/19/22 R radial head arthroplasty Extensive imaging work up 12/10/22; please refer to [facility] for details. Of note, reassuring CT head, maxillofacial, and cervical spine  PATIENT SURVEYS:  FOTO deferred given time constraints 01/16/23 4%  COGNITION: Overall cognitive status: Within functional limits for tasks assessed     SENSATION: Unable to assess due to wrapping - pt states he does have some tingling in lateral elbow. Noted swelling in fingers WNL for immobilization, no redness  POSTURE: Elevated R UT, R elbow in brace and ACE wrap  UPPER EXTREMITY ROM:   ROM Right eval Left eval Right 01/16/23 Right 01/22/23 Right 01/28/23  Shoulder flexion       Shoulder extension       Shoulder abduction       Shoulder adduction       Shoulder internal rotation       Shoulder external rotation       Elbow flexion P: 90 deg A: 136 deg P: 105 P: 105 AAROM: 107 deg  Elbow extension P: lacking 60   A: full  P: lacking 40 P: lacking 40 AAROM: Lacking 35 deg  Wrist flexion       Wrist extension       Wrist ulnar deviation       Wrist radial deviation       Wrist pronation       Wrist supination       (  Blank rows = not tested) (Key: WFL = within functional  limits not formally assessed, * = concordant pain, s = stiffness/stretching sensation, NT = not tested)  Comments:   UPPER EXTREMITY MMT:  MMT Right eval Left eval  Shoulder flexion    Shoulder extension    Shoulder abduction    Shoulder adduction    Shoulder internal rotation    Shoulder external rotation    Middle trapezius    Lower trapezius    Elbow flexion    Elbow extension    Wrist flexion    Wrist extension    Wrist ulnar deviation    Wrist radial deviation    Wrist pronation    Wrist supination    Grip strength (lbs)    (Blank rows = not tested) (Key: WFL = within functional limits not formally assessed, * = concordant pain, s = stiffness/stretching sensation, NT = not tested)  Comment: deferred on eval given acuity of surgery   JOINT MOBILITY TESTING:  Deferred given acuity of surgery  PALPATION:  Deferred given ACE wrap and brace    TODAY'S TREATMENT:    [facility] Adult PT Treatment:                                                DATE: 01/28/23 Therapeutic Exercise: Elbow flex/ext AAROM 2x10 Wrist flex/ext AAROM 2x10 Active grip ROM x10, + contralateral UE overpressure x10 Unresisted opposition x8 laps cues for pacing and appropriate positioning Scap retraction 2x10 cues for elbow positioning and reduced compensations Manual Therapy: Seated w/ elbow supported; PROM elbow flex/ext to pt tolerance, gentle STM biceps  [facility] Adult PT Treatment:                                                DATE: 01/22/23 Therapeutic Exercise: AAROM Rt elbow flex/ext using Lt UE for assist AROM Rt wrist flex/ext using Lt UE for assist Seated scapular retraction 2x10 Active gripping  Supination/pronation to neutral Manual Therapy: PROM right elbow/wrist flex/ext and shoulder flexion STM right bicep         [facility] Adult PT Treatment:                                                DATE: 01/16/23 Therapeutic Exercise: AAROM Rt elbow flex/ext using Lt UE for assist AAROM Rt  wrist flex/ext using Lt UE for assist Seated scapular retraction  Active gripping  Manual Therapy: PROM right elbow/wrist flex/ext and shoulder flexion STM right bicep Therapeutic Activity: FOTO assessment  [facility] Adult PT Treatment:                                                DATE: 01/10/23 Therapeutic Exercise: AAROM R elbow ext/flex w/ contralateral UE, seated; education on appropriate performance/setup, monitoring symptoms and modifying accordingly, and appropriate positioning. Handout provided in spanish    PATIENT EDUCATION: Education details: HEP Person educated: Patient and daughter Education method: Explanation, Demonstration, Actor cues, Verbal cues Education comprehension: verbalized understanding, returned demonstration, verbal cues required, tactile cues required, and needs further education    HOME EXERCISE PROGRAM: Access Code: [email or web address]  ASSESSMENT:  CLINICAL IMPRESSION: 01/28/2023 Pt arrives w/ continued pain, states feels about the same overall, denies any issues after PT sessions. His daughter remarks he is able to move arm a bit more and his arm looks better. Today continuing to work on AAROM/PROM within pt tolerance, modest improvements in ROM measurements as above. No adverse events, pt departs with report of continued pain but states comparable to prior sessions. Recommend continuing along current POC in order to address relevant deficits and improve functional tolerance. Pt departs today's session in no acute distress, all voiced questions/concerns addressed appropriately from PT perspective.     EVAL: Patient is a 47 y.o. gentleman who was seen today for physical therapy evaluation and treatment for radial head arthroplasty DOS 12/19/22. Pt arrives w/ hinge brace, endorses high pain levels since surgery and requiring assist for basic  daily activities. Per physician referral, okay for PROM/AAROM of elbow at this time; subsequently limited exam, although pt demonstrates significant limitations with elbow flexion and extension. Pt does well with HEP practice, emphasis on monitoring closely and appropriate positioning for comfort. Pt endorses fairly significant pain on arrival and continues throughout session but does not endorse any overt increase with today's activity. No adverse events, session is limited by late check in and increased time with subjective. Recommend skilled PT to address aforementioned deficits with aim of improving functional tolerance and reducing pain with typical activities. Pt departs today's session in no acute distress, all voiced concerns/questions addressed appropriately from PT perspective.    OBJECTIVE IMPAIRMENTS: decreased activity tolerance, decreased endurance, decreased mobility, decreased ROM, decreased strength, impaired flexibility, impaired UE functional use, postural dysfunction, and pain.   ACTIVITY LIMITATIONS: carrying, lifting, sleeping, bathing, toileting, dressing, self feeding, reach over head, and hygiene/grooming  PARTICIPATION LIMITATIONS: meal prep, cleaning, laundry, driving, community activity, and occupation  PERSONAL FACTORS: Time since onset of injury/illness/exacerbation are also affecting patient's functional outcome.    GOALS: Goals reviewed with patient? No given time constraints, did discuss role of PT and PT POC  SHORT TERM GOALS: Target date: 02/07/2023 Pt will demonstrate appropriate understanding and performance of initially prescribed HEP in order to facilitate improved independence with management of symptoms.  Baseline: HEP provided on eval Goal status: INITIAL   2. Pt will improve at least MCID on FOTO in order to demonstrate improved perception of function due to symptoms.  Baseline: FOTO TBD  Goal status: INITIAL    LONG TERM GOALS: Target date:  03/07/2023  Pt will meet predicted score on FOTO in order to demonstrate improved perception of function due to symptoms. Baseline: FOTO TBD 01/16/2023: 4% initial, predicted 52% Goal status: INITIAL  2.  Pt will demonstrate at least 10-120 degrees of active elbow ROM in order to demonstrate improved tolerance  to daily tasks. Baseline: see ROM chart above - PROM only on eval Goal status: INITIAL  3.  Pt will demonstrate grip strength that is at least 10# from symmetrical in order to demonstrate improved functional strength. Baseline: NT on eval given proximity to surgery Goal status: INITIAL  4. Pt will report ability to perform typical ADLs/self care independently with less than 3 point increase in pain on NPS.  Baseline: receiving assist from family per pt report  Goal status: INITIAL   5. Pt will be able to lift up to 30# for 5 repetitions with less than 3 pt increase in pain in order to facilitate improved functional tolerance.  Baseline: unable d/t post op precautions  Goal status: INITIAL    PLAN: PT FREQUENCY: 1-2x/week  PT DURATION: 8 weeks  PLANNED INTERVENTIONS: 97164- PT Re-evaluation, 97110-Therapeutic exercises, 97530- Therapeutic activity, 97112- Neuromuscular re-education, 97535- Self Care, [id number]- Manual therapy, [phone]- Gait training, Patient/Family education, Balance training, Stair training, Taping, Joint mobilization, Scar mobilization, Cryotherapy, and Moist heat  PLAN FOR NEXT SESSION: Review/update HEP PRN. PROM/AAROM to elbow. Wrist AAROM. Given L UCL repair also avoid valgus/varus stresses. Symptom modification strategies as indicated/appropriate.     Ashley Murrain PT, DPT 01/28/2023 5:11 PM

## 2023-01-28 ENCOUNTER — Encounter: Payer: Self-pay | Admitting: Physical Therapy

## 2023-01-28 ENCOUNTER — Ambulatory Visit: Payer: Self-pay | Admitting: Physical Therapy

## 2023-01-28 DIAGNOSIS — M25621 Stiffness of right elbow, not elsewhere classified: Secondary | ICD-10-CM

## 2023-01-28 DIAGNOSIS — M6281 Muscle weakness (generalized): Secondary | ICD-10-CM

## 2023-01-28 DIAGNOSIS — M25521 Pain in right elbow: Secondary | ICD-10-CM

## 2023-01-30 ENCOUNTER — Ambulatory Visit: Payer: Self-pay

## 2023-01-30 DIAGNOSIS — M6281 Muscle weakness (generalized): Secondary | ICD-10-CM

## 2023-01-30 DIAGNOSIS — M25621 Stiffness of right elbow, not elsewhere classified: Secondary | ICD-10-CM

## 2023-01-30 DIAGNOSIS — M25521 Pain in right elbow: Secondary | ICD-10-CM

## 2023-01-30 NOTE — Therapy (Signed)
OUTPATIENT PHYSICAL THERAPY TREATMENT NOTE   Patient Name: Willie Cox MRN: [id number] DOB:1975/09/24, 47 y.o., male Today's Date: 01/30/2023   END OF SESSION:  PT End of Session - 01/30/23 1653     Visit Number 5    Number of Visits 17    Date for PT Re-Evaluation 03/07/23    Authorization Type Self Pay    PT Start Time 1700    PT Stop Time 1720    PT Time Calculation (min) 20 min    Activity Tolerance Patient limited by pain;No increased pain    Behavior During Therapy [facility] for tasks assessed/performed             History reviewed. No pertinent past medical history. Past Surgical History:  Procedure Laterality Date   FOREIGN BODY REMOVAL Right 12/19/2022   Procedure: REMOVAL LOOSE BODIES;  Surgeon: Luci Bank, MD;  Location: [facility];  Service: Orthopedics;  Laterality: Right;   ORIF ELBOW FRACTURE Right 12/19/2022   Procedure: RIGHT RADIAL HEAD ARTHROPLASTY, RIGHT CORONOID OPEN REDUCATION INTERNAL FIXATION, RIGHT LATERAL ULNAR COLLATERAL LIGAMENT REPAIR, REMOVAL OF LOOSE BONE;  Surgeon: Luci Bank, MD;  Location: [facility];  Service: Orthopedics;  Laterality: Right;   Patient Active Problem List   Diagnosis Date Noted   History of arthroplasty of right elbow 12/19/2022   Closed dislocation of right elbow 12/19/2022    PCP: no PCP in chart  REFERRING PROVIDER: Luci Bank, MD  REFERRING DIAG: Status Post Right Elbow Radial Head Arthroplasty  THERAPY DIAG:  Pain in right elbow  Muscle weakness (generalized)  Stiffness of right elbow, not elsewhere classified  Rationale for Evaluation and Treatment: Rehabilitation  ONSET DATE: DOS 12/19/22 (right radial head arthroplasty, ORIF R coronoid fracture, R LUCL repair)  SUBJECTIVE:                                                                                                                                                                                      SUBJECTIVE  STATEMENT: Patient reports continued pain and soreness in his his elbow. Daughter an interpreter provide info, stating he started on prednisone yesterday and that his BP has been high since starting this and that he "hasn't been feeling like himself."    EVAL: Pt arrives with hinge brace. States he fell from roof while at work and injured his arm, went to hospital. States this occurred on 12/10/22, got scheduled for surgery on 10th. States he followed up with surgeon Wednesday - states they sent him for therapy as he was pretty stiff, and he notes significant increase in pain over past few days. States he was told he  can take his brace off at home but wear it when he is out and doing activity. Had staples taken out Wednesday, states he was told incision doing well. Has ACE wrap on elbow and pt states he was told to keep it wrapped until next surgeon visit. Pt states he has a lot of pain since surgery, seems to be a little better but still pretty significant particularly when not taking medication. Has been using ice, 2-3x/day for ~45min at time. States since surgery he's getting help from family for dressing and daily tasks. Previously active for his work duties, heavy lifting. He also notes that since fall he has had headaches and multi joint pain that is consistent, denies any recent changes - states he has been communicating with providers about this and had extensive imaging (corroborated through EPIC review). Hand dominance: Right  PERTINENT HISTORY: Radial head arthroplasty 12/19/22 Pt denies issues with heart, blood pressure, hx cancer   PAIN:  Are you having pain: unrated 01/28/23 Location/description: R elbow and shoulder, wrist - aggravating factors: movement, constant - Easing factors: medication    PRECAUTIONS: Elbow surgery; radial head replacement RUE Per physician referral: PROM/AAROM, NWB   WEIGHT BEARING RESTRICTIONS: Yes NWB RUE  FALLS:  Has patient fallen in last 6 months?  Yes. Number of falls 1 fall, precipitating episode. 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  DIAGNOSTIC FINDINGS:  DOS 12/19/22 R radial head arthroplasty Extensive imaging work up 12/10/22; please refer to [facility] for details. Of note, reassuring CT head, maxillofacial, and cervical spine  PATIENT SURVEYS:  FOTO deferred given time constraints 01/16/23 4%  COGNITION: Overall cognitive status: Within functional limits for tasks assessed     SENSATION: Unable to assess due to wrapping - pt states he does have some tingling in lateral elbow. Noted swelling in fingers WNL for immobilization, no redness  POSTURE: Elevated R UT, R elbow in brace and ACE wrap  UPPER EXTREMITY ROM:   ROM Right eval Left eval Right 01/16/23 Right 01/22/23 Right 01/28/23  Shoulder flexion       Shoulder extension       Shoulder abduction       Shoulder adduction       Shoulder internal rotation       Shoulder external rotation       Elbow flexion P: 90 deg A: 136 deg P: 105 P: 105 AAROM: 107 deg  Elbow extension P: lacking 60   A: full  P: lacking 40 P: lacking 40 AAROM: Lacking 35 deg  Wrist flexion       Wrist extension       Wrist ulnar deviation       Wrist radial deviation       Wrist pronation       Wrist supination       (Blank rows = not tested) (Key: WFL = within functional limits not formally assessed, * = concordant pain, s = stiffness/stretching sensation, NT = not tested)  Comments:   UPPER EXTREMITY MMT:  MMT Right eval Left eval  Shoulder flexion    Shoulder  extension    Shoulder abduction    Shoulder adduction    Shoulder internal rotation    Shoulder external rotation    Middle trapezius    Lower trapezius    Elbow flexion    Elbow extension    Wrist flexion    Wrist extension    Wrist ulnar deviation    Wrist radial deviation    Wrist pronation    Wrist supination    Grip strength (lbs)    (Blank rows = not tested) (Key: WFL = within functional limits not formally assessed, * = concordant pain, s = stiffness/stretching sensation, NT = not tested)  Comment: deferred on eval given acuity of surgery   JOINT MOBILITY TESTING:  Deferred given acuity of surgery  PALPATION:  Deferred given ACE wrap and brace    TODAY'S TREATMENT:    [facility] Adult PT Treatment:                                                DATE: 01/30/23 BP at beginning of session: 157/108  Therapeutic Activity: Education on red flag symptoms for hypertension, contacting doctor about medication changes    [facility] Adult PT Treatment:                                                DATE: 01/28/23 Therapeutic Exercise: Elbow flex/ext AAROM 2x10 Wrist flex/ext AAROM 2x10 Active grip ROM x10, + contralateral UE overpressure x10 Unresisted opposition x8 laps cues for pacing and appropriate positioning Scap retraction 2x10 cues for elbow positioning and reduced compensations Manual Therapy: Seated w/ elbow supported; PROM elbow flex/ext to pt tolerance, gentle STM biceps  [facility] Adult PT Treatment:                                                DATE: 01/22/23 Therapeutic Exercise: AAROM Rt elbow flex/ext using Lt UE for assist AROM Rt wrist flex/ext using Lt UE for assist Seated scapular retraction 2x10 Active gripping  Supination/pronation to neutral Manual Therapy: PROM right elbow/wrist flex/ext and shoulder flexion STM right bicep          PATIENT EDUCATION: Education details: HEP Person educated: Patient and daughter Education method: Explanation,  Demonstration, Actor cues, Verbal cues Education comprehension: verbalized understanding, returned demonstration, verbal cues required, tactile cues required, and needs further education    HOME EXERCISE PROGRAM: Access Code: [email or web address]  ASSESSMENT:  CLINICAL IMPRESSION: Patient presents to PT reporting continued pain and soreness, especially after exercises and PT. Daughter reports that he started Prednisone yesterday and has had high blood pressure since and also states that he has "not been feeling like himself." Blood pressure taken at beginning session at 157/108. Out of an increased abundance of caution, did not continue with PT session today and advised patient and daughter to follow up with MD regarding the medication and change in status of his blood pressure. Advised on red flag hypertension symptoms that would require going to the emergency room. Patient continues to benefit from skilled PT services and should be progressed  as able to improve functional independence.    EVAL: Patient is a 47 y.o. gentleman who was seen today for physical therapy evaluation and treatment for radial head arthroplasty DOS 12/19/22. Pt arrives w/ hinge brace, endorses high pain levels since surgery and requiring assist for basic daily activities. Per physician referral, okay for PROM/AAROM of elbow at this time; subsequently limited exam, although pt demonstrates significant limitations with elbow flexion and extension. Pt does well with HEP practice, emphasis on monitoring closely and appropriate positioning for comfort. Pt endorses fairly significant pain on arrival and continues throughout session but does not endorse any overt increase with today's activity. No adverse events, session is limited by late check in and increased time with subjective. Recommend skilled PT to address aforementioned deficits with aim of improving functional tolerance and reducing pain with typical activities. Pt departs today's  session in no acute distress, all voiced concerns/questions addressed appropriately from PT perspective.    OBJECTIVE IMPAIRMENTS: decreased activity tolerance, decreased endurance, decreased mobility, decreased ROM, decreased strength, impaired flexibility, impaired UE functional use, postural dysfunction, and pain.   ACTIVITY LIMITATIONS: carrying, lifting, sleeping, bathing, toileting, dressing, self feeding, reach over head, and hygiene/grooming  PARTICIPATION LIMITATIONS: meal prep, cleaning, laundry, driving, community activity, and occupation  PERSONAL FACTORS: Time since onset of injury/illness/exacerbation are also affecting patient's functional outcome.    GOALS: Goals reviewed with patient? No given time constraints, did discuss role of PT and PT POC  SHORT TERM GOALS: Target date: 02/07/2023 Pt will demonstrate appropriate understanding and performance of initially prescribed HEP in order to facilitate improved independence with management of symptoms.  Baseline: HEP provided on eval Goal status: INITIAL   2. Pt will improve at least MCID on FOTO in order to demonstrate improved perception of function due to symptoms.  Baseline: FOTO TBD  Goal status: INITIAL    LONG TERM GOALS: Target date: 03/07/2023  Pt will meet predicted score on FOTO in order to demonstrate improved perception of function due to symptoms. Baseline: FOTO TBD 01/16/2023: 4% initial, predicted 52% Goal status: INITIAL  2.  Pt will demonstrate at least 10-120 degrees of active elbow ROM in order to demonstrate improved tolerance to daily tasks. Baseline: see ROM chart above - PROM only on eval Goal status: INITIAL  3.  Pt will demonstrate grip strength that is at least 10# from symmetrical in order to demonstrate improved functional strength. Baseline: NT on eval given proximity to surgery Goal status: INITIAL  4. Pt will report ability to perform typical ADLs/self care independently with less than 3  point increase in pain on NPS.  Baseline: receiving assist from family per pt report  Goal status: INITIAL   5. Pt will be able to lift up to 30# for 5 repetitions with less than 3 pt increase in pain in order to facilitate improved functional tolerance.  Baseline: unable d/t post op precautions  Goal status: INITIAL    PLAN: PT FREQUENCY: 1-2x/week  PT DURATION: 8 weeks  PLANNED INTERVENTIONS: 97164- PT Re-evaluation, 97110-Therapeutic exercises, 97530- Therapeutic activity, 97112- Neuromuscular re-education, 97535- Self Care, [id number]- Manual therapy, [phone]- Gait training, Patient/Family education, Balance training, Stair training, Taping, Joint mobilization, Scar mobilization, Cryotherapy, and Moist heat  PLAN FOR NEXT SESSION: Review/update HEP PRN. PROM/AAROM to elbow. Wrist AAROM. Given L UCL repair also avoid valgus/varus stresses. Symptom modification strategies as indicated/appropriate.     Berta Minor PTA 01/30/2023 5:33 PM

## 2023-02-03 ENCOUNTER — Encounter: Payer: Self-pay | Admitting: Physical Therapy

## 2023-02-03 ENCOUNTER — Ambulatory Visit: Payer: Self-pay | Admitting: Physical Therapy

## 2023-02-03 DIAGNOSIS — M25621 Stiffness of right elbow, not elsewhere classified: Secondary | ICD-10-CM

## 2023-02-03 DIAGNOSIS — M6281 Muscle weakness (generalized): Secondary | ICD-10-CM

## 2023-02-03 DIAGNOSIS — M25521 Pain in right elbow: Secondary | ICD-10-CM

## 2023-02-03 NOTE — Therapy (Signed)
OUTPATIENT PHYSICAL THERAPY TREATMENT NOTE + PROGRESS NOTE   Patient Name: Willie Cox MRN: [id number] DOB:[birth year], 47 y.o., male Today's Date: 02/03/2023   Progress Note Reporting Period 01/10/23 to 02/03/23  See note below for Objective Data and Assessment of Progress/Goals.       END OF SESSION:  PT End of Session - 02/03/23 1537     Visit Number 6    Number of Visits 17    Date for PT Re-Evaluation 03/07/23    Authorization Type Self Pay    PT Start Time 1538   late check in   PT Stop Time 1615    PT Time Calculation (min) 37 min    Activity Tolerance Patient limited by pain;No increased pain    Behavior During Therapy [facility] for tasks assessed/performed              History reviewed. No pertinent past medical history. Past Surgical History:  Procedure Laterality Date   FOREIGN BODY REMOVAL Right 12/19/2022   Procedure: REMOVAL LOOSE BODIES;  Surgeon: Luci Bank, MD;  Location: [facility];  Service: Orthopedics;  Laterality: Right;   ORIF ELBOW FRACTURE Right 12/19/2022   Procedure: RIGHT RADIAL HEAD ARTHROPLASTY, RIGHT CORONOID OPEN REDUCATION INTERNAL FIXATION, RIGHT LATERAL ULNAR COLLATERAL LIGAMENT REPAIR, REMOVAL OF LOOSE BONE;  Surgeon: Luci Bank, MD;  Location: [facility];  Service: Orthopedics;  Laterality: Right;   Patient Active Problem List   Diagnosis Date Noted   History of arthroplasty of right elbow 12/19/2022   Closed dislocation of right elbow 12/19/2022    PCP: no PCP in chart  REFERRING PROVIDER: Luci Bank, MD  REFERRING DIAG: Status Post Right Elbow Radial Head Arthroplasty  THERAPY DIAG:  Pain in right elbow  Muscle weakness (generalized)  Stiffness of right elbow, not elsewhere classified  Rationale for Evaluation and Treatment: Rehabilitation  ONSET DATE: DOS 12/19/22 (right radial head arthroplasty, ORIF R coronoid fracture, R LUCL repair)  SUBJECTIVE:                                                                                                                                                                                       SUBJECTIVE STATEMENT: 02/03/2023 Pt reports increased pain over past few days which he attributes in part to colder weather. States he stopped taking his prednisone - encouraged to discuss with his provider. He does state blood pressure has been a bit better as well, checked it over the weekend. Accompanied by daughter and in person interpreter.   EVAL: Pt arrives with hinge brace. States he fell from roof while at work and injured his arm, went to hospital.  States this occurred on 12/10/22, got scheduled for surgery on 10th. States he followed up with surgeon Wednesday - states they sent him for therapy as he was pretty stiff, and he notes significant increase in pain over past few days. States he was told he can take his brace off at home but wear it when he is out and doing activity. Had staples taken out Wednesday, states he was told incision doing well. Has ACE wrap on elbow and pt states he was told to keep it wrapped until next surgeon visit. Pt states he has a lot of pain since surgery, seems to be a little better but still pretty significant particularly when not taking medication. Has been using ice, 2-3x/day for ~60min at time. States since surgery he's getting help from family for dressing and daily tasks. Previously active for his work duties, heavy lifting. He also notes that since fall he has had headaches and multi joint pain that is consistent, denies any recent changes - states he has been communicating with providers about this and had extensive imaging (corroborated through EPIC review). Hand dominance: Right  PERTINENT HISTORY: Radial head arthroplasty 12/19/22 Pt denies issues with heart, blood pressure, hx cancer   PAIN:  Are you having pain: unrated 02/03/23   Location/description: R elbow and shoulder, wrist - aggravating factors: movement,  constant - Easing factors: medication    PRECAUTIONS: Elbow surgery; radial head replacement RUE Per physician referral: PROM/AAROM, NWB   WEIGHT BEARING RESTRICTIONS: Yes NWB RUE  FALLS:  Has patient fallen in last 6 months? Yes. Number of falls 1 fall, precipitating episode. 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  DIAGNOSTIC FINDINGS:  DOS 12/19/22 R radial head arthroplasty Extensive imaging work up 12/10/22; please refer to [facility] for details. Of note, reassuring CT head, maxillofacial, and cervical spine  PATIENT SURVEYS:  FOTO deferred given time constraints 01/16/23 4% 02/03/23 4%  COGNITION: Overall cognitive status: Within functional limits for tasks assessed     SENSATION: Unable to assess due to wrapping - pt states he does have some tingling in lateral elbow. Noted swelling in fingers WNL for immobilization, no redness  POSTURE: Elevated R UT, R elbow in brace and ACE wrap  UPPER EXTREMITY ROM:   ROM Right eval Left eval Right 01/16/23 Right 01/22/23 Right 01/28/23  Shoulder flexion       Shoulder extension       Shoulder abduction       Shoulder adduction       Shoulder internal rotation       Shoulder external rotation       Elbow flexion P: 90 deg A: 136 deg P: 105 P: 105 AAROM: 107 deg  Elbow extension P: lacking 60   A: full  P: lacking 40 P: lacking 40 AAROM: Lacking 35 deg  Wrist flexion       Wrist extension       Wrist ulnar deviation       Wrist radial deviation       Wrist pronation  Wrist supination       (Blank rows = not  tested) (Key: WFL = within functional limits not formally assessed, * = concordant pain, s = stiffness/stretching sensation, NT = not tested)  Comments:   UPPER EXTREMITY MMT:  MMT Right eval Left eval  Shoulder flexion    Shoulder extension    Shoulder abduction    Shoulder adduction    Shoulder internal rotation    Shoulder external rotation    Middle trapezius    Lower trapezius    Elbow flexion    Elbow extension    Wrist flexion    Wrist extension    Wrist ulnar deviation    Wrist radial deviation    Wrist pronation    Wrist supination    Grip strength (lbs)    (Blank rows = not tested) (Key: WFL = within functional limits not formally assessed, * = concordant pain, s = stiffness/stretching sensation, NT = not tested)  Comment: deferred on eval given acuity of surgery   JOINT MOBILITY TESTING:  Deferred given acuity of surgery  PALPATION:  Deferred given ACE wrap and brace    TODAY'S TREATMENT:    [facility] Adult PT Treatment:                                                DATE: 02/03/23 Vitals: LUE BP 169/110, HR 81 Therapeutic Activity: FOTO + education Education/discussion re: progress w/ PT thus far, limitations with RUE, PT goals as below Education/discussion re: BP as it affects activity tolerance and safety w/ exercise, normative values, red flags, communication w/ provider    [facility] Adult PT Treatment:                                                DATE: 01/30/23 BP at beginning of session: 157/108  Therapeutic Activity: Education on red flag symptoms for hypertension, contacting doctor about medication changes    [facility] Adult PT Treatment:                                                DATE: 01/28/23 Therapeutic Exercise: Elbow flex/ext AAROM 2x10 Wrist flex/ext AAROM 2x10 Active grip ROM x10, + contralateral UE overpressure x10 Unresisted opposition x8 laps cues for pacing and appropriate positioning Scap retraction 2x10 cues for elbow positioning  and reduced compensations Manual Therapy: Seated w/ elbow supported; PROM elbow flex/ext to pt tolerance, gentle STM biceps          PATIENT EDUCATION: Education details: see therapeutic activity section above (02/03/23) Person educated: Patient and daughter Education method: Explanation, Demonstration, Tactile cues, Verbal cues Education comprehension: verbalized understanding, returned demonstration, verbal cues required, tactile cues required, and needs further education    HOME EXERCISE PROGRAM: Access Code: [email or web address]  ASSESSMENT:  CLINICAL IMPRESSION: 02/03/2023 Pt arrived w/ report of increased pain over the weekend which he attributed in part to the weather. Stated he has stopped taking prednisone and feels better, checked BP over the weekend and was a little lower than in clinic at last visit. Today pt remained hypertensive and outside of  acceptable limits for PT (see vitals above). We did administer FOTO which was unchanged compared to initial administration and pt continued to endorse inability to use operative limb. Deferred exercise/ROM today given hypertension, pt reported follow up with surgeon on 02/04/23 - encouraged continued follow up, pt also had questions regarding his self-cessation of prednisone and was encouraged to discuss this with his provider. In discussion re: hypertension, pt stated this is a new issue for him and he does not have a PCP - encouraged him to discuss with surgeon as at this point consistent hypertension has limited ability to safely participate in PT for two consecutive sessions. Continued education on red flag symptoms for HTN and appropriate action to take should they occur. Daughter present and stated they would try to reach out to MD again after session. No adverse events, pt denies any red flag/concerning symptoms today. Recommend continuing along current POC in order to address relevant deficits and improve functional tolerance. Pt departs today's  session in no acute distress, all voiced questions/concerns addressed appropriately from PT perspective.     EVAL: Patient is a 48 y.o. gentleman who was seen today for physical therapy evaluation and treatment for radial head arthroplasty DOS 12/19/22. Pt arrives w/ hinge brace, endorses high pain levels since surgery and requiring assist for basic daily activities. Per physician referral, okay for PROM/AAROM of elbow at this time; subsequently limited exam, although pt demonstrates significant limitations with elbow flexion and extension. Pt does well with HEP practice, emphasis on monitoring closely and appropriate positioning for comfort. Pt endorses fairly significant pain on arrival and continues throughout session but does not endorse any overt increase with today's activity. No adverse events, session is limited by late check in and increased time with subjective. Recommend skilled PT to address aforementioned deficits with aim of improving functional tolerance and reducing pain with typical activities. Pt departs today's session in no acute distress, all voiced concerns/questions addressed appropriately from PT perspective.    OBJECTIVE IMPAIRMENTS: decreased activity tolerance, decreased endurance, decreased mobility, decreased ROM, decreased strength, impaired flexibility, impaired UE functional use, postural dysfunction, and pain.   ACTIVITY LIMITATIONS: carrying, lifting, sleeping, bathing, toileting, dressing, self feeding, reach over head, and hygiene/grooming  PARTICIPATION LIMITATIONS: meal prep, cleaning, laundry, driving, community activity, and occupation  PERSONAL FACTORS: Time since onset of injury/illness/exacerbation are also affecting patient's functional outcome.    GOALS: Goals reviewed with patient? No given time constraints, did discuss role of PT and PT POC  SHORT TERM GOALS: Target date: 02/07/2023 Pt will demonstrate appropriate understanding and performance of  initially prescribed HEP in order to facilitate improved independence with management of symptoms.  Baseline: HEP provided on eval 02/03/23: reports good HEP adherence w/ family assist  Goal status: MET   2. Pt will improve at least MCID on FOTO in order to demonstrate improved perception of function due to symptoms.  Baseline: FOTO TBD  01/16/2023: 4% initial, predicted 52% 02/03/23: 4%  Goal status: NOT MET   LONG TERM GOALS: Target date: 03/07/2023  Pt will meet predicted score on FOTO in order to demonstrate improved perception of function due to symptoms. Baseline: FOTO TBD 01/16/2023: 4% initial, predicted 52% 02/03/23: 4%  Goal status: ONGOING   2.  Pt will demonstrate at least 10-120 degrees of active elbow ROM in order to demonstrate improved tolerance to daily tasks. Baseline: see ROM chart above - PROM only on eval 02/03/23: see ROM chart above (AROM deferred given restrictions) Goal status: ONGOING  3.  Pt will demonstrate grip strength that is at least 10# from symmetrical in order to demonstrate improved functional strength. Baseline: NT on eval given proximity to surgery 02/03/23: NT given post op restrictions  Goal status: ONGOING   4. Pt will report ability to perform typical ADLs/self care independently with less than 3 point increase in pain on NPS.  Baseline: receiving assist from family per pt report 02/03/23: continues to endorse inability to use RUE for daily tasks  Goal status: ONGOING   5. Pt will be able to lift up to 30# for 5 repetitions with less than 3 pt increase in pain in order to facilitate improved functional tolerance.  Baseline: unable d/t post op precautions 02/03/23: unable d/t post op precautions  Goal status: ONGOING    PLAN: PT FREQUENCY: 1-2x/week  PT DURATION: 8 weeks  PLANNED INTERVENTIONS: 97164- PT Re-evaluation, 97110-Therapeutic exercises, 97530- Therapeutic activity, 97112- Neuromuscular re-education, 97535- Self Care,  97140- Manual therapy, [phone]- Gait training, Patient/Family education, Balance training, Stair training, Taping, Joint mobilization, Scar mobilization, Cryotherapy, and Moist heat  PLAN FOR NEXT SESSION: Review/update HEP PRN. PROM/AAROM to elbow. Wrist AAROM. Given L UCL repair also avoid valgus/varus stresses. Symptom modification strategies as indicated/appropriate.  Monitor BP and check in after MD visit    Ashley Murrain PT, DPT 02/03/2023 5:22 PM

## 2023-02-05 ENCOUNTER — Ambulatory Visit: Payer: Self-pay

## 2023-02-05 DIAGNOSIS — M25521 Pain in right elbow: Secondary | ICD-10-CM

## 2023-02-05 DIAGNOSIS — M6281 Muscle weakness (generalized): Secondary | ICD-10-CM

## 2023-02-05 DIAGNOSIS — M25621 Stiffness of right elbow, not elsewhere classified: Secondary | ICD-10-CM

## 2023-02-05 NOTE — Therapy (Addendum)
OUTPATIENT PHYSICAL THERAPY TREATMENT NOTE + NO VISIT DISCHARGE SUMMARY (see below)    Patient Name: Willie Cox MRN: [id number] DOB:June 15, 1975, 47 y.o., male Today's Date: 02/05/2023    END OF SESSION:  PT End of Session - 02/05/23 1610     Visit Number 7    Number of Visits 17    Date for PT Re-Evaluation 03/07/23    Authorization Type Self Pay    PT Start Time 1612    PT Stop Time 1652    PT Time Calculation (min) 40 min    Activity Tolerance Patient limited by pain;No increased pain    Behavior During Therapy [facility] for tasks assessed/performed               History reviewed. No pertinent past medical history. Past Surgical History:  Procedure Laterality Date   FOREIGN BODY REMOVAL Right 12/19/2022   Procedure: REMOVAL LOOSE BODIES;  Surgeon: Luci Bank, MD;  Location: [facility];  Service: Orthopedics;  Laterality: Right;   ORIF ELBOW FRACTURE Right 12/19/2022   Procedure: RIGHT RADIAL HEAD ARTHROPLASTY, RIGHT CORONOID OPEN REDUCATION INTERNAL FIXATION, RIGHT LATERAL ULNAR COLLATERAL LIGAMENT REPAIR, REMOVAL OF LOOSE BONE;  Surgeon: Luci Bank, MD;  Location: [facility];  Service: Orthopedics;  Laterality: Right;   Patient Active Problem List   Diagnosis Date Noted   History of arthroplasty of right elbow 12/19/2022   Closed dislocation of right elbow 12/19/2022    PCP: no PCP in chart  REFERRING PROVIDER: Luci Bank, MD  REFERRING DIAG: Status Post Right Elbow Radial Head Arthroplasty  THERAPY DIAG:  Pain in right elbow  Muscle weakness (generalized)  Stiffness of right elbow, not elsewhere classified  Rationale for Evaluation and Treatment: Rehabilitation  ONSET DATE: DOS 12/19/22 (right radial head arthroplasty, ORIF R coronoid fracture, R LUCL repair)  SUBJECTIVE:                                                                                                                                                                                       SUBJECTIVE STATEMENT: Patient reports that he saw the MD who stated he no longer has wear his brace. They also recommended her obtain a PCP to help with the BP issues. Accompanied by daughter and in person interpreter. He reports he took his BP this morning at 137/102. He reports continued pain radiating down to fingertips.    EVAL: Pt arrives with hinge brace. States he fell from roof while at work and injured his arm, went to hospital. States this occurred on 12/10/22, got scheduled for surgery on 10th. States he followed up with surgeon Wednesday - states  they sent him for therapy as he was pretty stiff, and he notes significant increase in pain over past few days. States he was told he can take his brace off at home but wear it when he is out and doing activity. Had staples taken out Wednesday, states he was told incision doing well. Has ACE wrap on elbow and pt states he was told to keep it wrapped until next surgeon visit. Pt states he has a lot of pain since surgery, seems to be a little better but still pretty significant particularly when not taking medication. Has been using ice, 2-3x/day for ~40min at time. States since surgery he's getting help from family for dressing and daily tasks. Previously active for his work duties, heavy lifting. He also notes that since fall he has had headaches and multi joint pain that is consistent, denies any recent changes - states he has been communicating with providers about this and had extensive imaging (corroborated through EPIC review). Hand dominance: Right  PERTINENT HISTORY: Radial head arthroplasty 12/19/22 Pt denies issues with heart, blood pressure, hx cancer   PAIN:  Are you having pain: unrated 02/03/23   Location/description: R elbow and shoulder, wrist - aggravating factors: movement, constant - Easing factors: medication    PRECAUTIONS: Elbow surgery; radial head replacement RUE Per physician referral: PROM/AAROM,  NWB   WEIGHT BEARING RESTRICTIONS: Yes NWB RUE  FALLS:  Has patient fallen in last 6 months? Yes. Number of falls 1 fall, precipitating episode. 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  DIAGNOSTIC FINDINGS:  DOS 12/19/22 R radial head arthroplasty Extensive imaging work up 12/10/22; please refer to [facility] for details. Of note, reassuring CT head, maxillofacial, and cervical spine  PATIENT SURVEYS:  FOTO deferred given time constraints 01/16/23 4% 02/03/23 4%  COGNITION: Overall cognitive status: Within functional limits for tasks assessed     SENSATION: Unable to assess due to wrapping - pt states he does have some tingling in lateral elbow. Noted swelling in fingers WNL for immobilization, no redness  POSTURE: Elevated R UT, R elbow in brace and ACE wrap  UPPER EXTREMITY ROM:   ROM Right eval Left eval Right 01/16/23 Right 01/22/23 Right 01/28/23 Right  02/05/23  Shoulder flexion        Shoulder extension        Shoulder abduction        Shoulder adduction        Shoulder internal rotation        Shoulder external rotation        Elbow flexion P: 90 deg A: 136 deg P: 105 P: 105 AAROM: 107 deg AROM: 105  Elbow extension P: lacking 60   A: full  P: lacking 40 P: lacking 40 AAROM: Lacking 35 deg AROM lacking 35  Wrist flexion        Wrist extension        Wrist ulnar deviation        Wrist radial deviation        Wrist pronation  Wrist supination        (Blank rows = not tested) (Key: WFL = within functional limits not formally assessed, * =  concordant pain, s = stiffness/stretching sensation, NT = not tested)  Comments:   UPPER EXTREMITY MMT:  MMT Right eval Left eval  Shoulder flexion    Shoulder extension    Shoulder abduction    Shoulder adduction    Shoulder internal rotation    Shoulder external rotation    Middle trapezius    Lower trapezius    Elbow flexion    Elbow extension    Wrist flexion    Wrist extension    Wrist ulnar deviation    Wrist radial deviation    Wrist pronation    Wrist supination    Grip strength (lbs)    (Blank rows = not tested) (Key: WFL = within functional limits not formally assessed, * = concordant pain, s = stiffness/stretching sensation, NT = not tested)  Comment: deferred on eval given acuity of surgery   JOINT MOBILITY TESTING:  Deferred given acuity of surgery  PALPATION:  Deferred given ACE wrap and brace    TODAY'S TREATMENT:    [facility] Adult PT Treatment:                                                DATE: 02/05/23 Vitals prior to session: 141/103 HR 90 Therapeutic Exercise: Elbow flex/ext AAROM 2x10 Seated LLLD stretch with wrist neutral elbow on towel x2' Wrist flex/ext AAROM 2x10 elbow supported on towel Active grip ROM x20 Unresisted opposition x8 laps cues for pacing and appropriate positioning Scap retraction 2x10 cues for elbow positioning and reduced compensations Manual Therapy: Supine w/ elbow supported; PROM elbow flex/ext to pt tolerance, gentle STM biceps Therapeutic Activity: Education/discussion re: BP as it affects activity tolerance and safety w/ exercise, normative values, red flags, communication w/ provider   [facility] Adult PT Treatment:                                                DATE: 02/03/23 Vitals: LUE BP 169/110, HR 81 Therapeutic Activity: FOTO + education Education/discussion re: progress w/ PT thus far, limitations with RUE, PT goals as below Education/discussion re: BP as it affects activity tolerance and safety w/ exercise,  normative values, red flags, communication w/ provider    [facility] Adult PT Treatment:                                                DATE: 01/30/23 BP at beginning of session: 157/108  Therapeutic Activity: Education on red flag symptoms for hypertension, contacting doctor about medication changes    [facility] Adult PT Treatment:                                                DATE: 01/28/23 Therapeutic Exercise: Elbow flex/ext AAROM 2x10 Wrist flex/ext AAROM 2x10 Active grip ROM x10, + contralateral UE overpressure x10 Unresisted  opposition x8 laps cues for pacing and appropriate positioning Scap retraction 2x10 cues for elbow positioning and reduced compensations Manual Therapy: Seated w/ elbow supported; PROM elbow flex/ext to pt tolerance, gentle STM biceps          PATIENT EDUCATION: Education details: see therapeutic activity section above (02/03/23) Person educated: Patient and daughter Education method: Explanation, Demonstration, Tactile cues, Verbal cues Education comprehension: verbalized understanding, returned demonstration, verbal cues required, tactile cues required, and needs further education    HOME EXERCISE PROGRAM: Access Code: [email or web address]  ASSESSMENT:  CLINICAL IMPRESSION: Patient presents to PT reporting continued high levels of pain and that he saw his MD yesterday who D/C the brace but stated to keep the ace bandage. No specific protocol provided for ROM or strengthening at this time. Vitals taken at beginning of session prior to exercise, in acceptable range for performing PT today. Advised patient to continue pursuing a PCP to have to monitor BP. Worked on gentle ROM or shoulder, elbow, wrist and fingers today, he does not endorse any increased pain with exercises. Patient continues to benefit from skilled PT services and should be progressed as able to improve functional independence.    EVAL: Patient is a 47 y.o. gentleman who was seen today for physical therapy  evaluation and treatment for radial head arthroplasty DOS 12/19/22. Pt arrives w/ hinge brace, endorses high pain levels since surgery and requiring assist for basic daily activities. Per physician referral, okay for PROM/AAROM of elbow at this time; subsequently limited exam, although pt demonstrates significant limitations with elbow flexion and extension. Pt does well with HEP practice, emphasis on monitoring closely and appropriate positioning for comfort. Pt endorses fairly significant pain on arrival and continues throughout session but does not endorse any overt increase with today's activity. No adverse events, session is limited by late check in and increased time with subjective. Recommend skilled PT to address aforementioned deficits with aim of improving functional tolerance and reducing pain with typical activities. Pt departs today's session in no acute distress, all voiced concerns/questions addressed appropriately from PT perspective.    OBJECTIVE IMPAIRMENTS: decreased activity tolerance, decreased endurance, decreased mobility, decreased ROM, decreased strength, impaired flexibility, impaired UE functional use, postural dysfunction, and pain.   ACTIVITY LIMITATIONS: carrying, lifting, sleeping, bathing, toileting, dressing, self feeding, reach over head, and hygiene/grooming  PARTICIPATION LIMITATIONS: meal prep, cleaning, laundry, driving, community activity, and occupation  PERSONAL FACTORS: Time since onset of injury/illness/exacerbation are also affecting patient's functional outcome.    GOALS: Goals reviewed with patient? No given time constraints, did discuss role of PT and PT POC  SHORT TERM GOALS: Target date: 02/07/2023 Pt will demonstrate appropriate understanding and performance of initially prescribed HEP in order to facilitate improved independence with management of symptoms.  Baseline: HEP provided on eval 02/03/23: reports good HEP adherence w/ family assist  Goal  status: MET   2. Pt will improve at least MCID on FOTO in order to demonstrate improved perception of function due to symptoms.  Baseline: FOTO TBD  01/16/2023: 4% initial, predicted 52% 02/03/23: 4%  Goal status: NOT MET   LONG TERM GOALS: Target date: 03/07/2023  Pt will meet predicted score on FOTO in order to demonstrate improved perception of function due to symptoms. Baseline: FOTO TBD 01/16/2023: 4% initial, predicted 52% 02/03/23: 4%  Goal status: ONGOING   2.  Pt will demonstrate at least 10-120 degrees of active elbow ROM in order to demonstrate improved tolerance to daily tasks. Baseline: see ROM chart  above - PROM only on eval 02/03/23: see ROM chart above (AROM deferred given restrictions) Goal status: ONGOING   3.  Pt will demonstrate grip strength that is at least 10# from symmetrical in order to demonstrate improved functional strength. Baseline: NT on eval given proximity to surgery 02/03/23: NT given post op restrictions  Goal status: ONGOING   4. Pt will report ability to perform typical ADLs/self care independently with less than 3 point increase in pain on NPS.  Baseline: receiving assist from family per pt report 02/03/23: continues to endorse inability to use RUE for daily tasks  Goal status: ONGOING   5. Pt will be able to lift up to 30# for 5 repetitions with less than 3 pt increase in pain in order to facilitate improved functional tolerance.  Baseline: unable d/t post op precautions 02/03/23: unable d/t post op precautions  Goal status: ONGOING    PLAN: PT FREQUENCY: 1-2x/week  PT DURATION: 8 weeks  PLANNED INTERVENTIONS: 97164- PT Re-evaluation, 97110-Therapeutic exercises, 97530- Therapeutic activity, 97112- Neuromuscular re-education, 97535- Self Care, [id number]- Manual therapy, [phone]- Gait training, Patient/Family education, Balance training, Stair training, Taping, Joint mobilization, Scar mobilization, Cryotherapy, and Moist heat  PLAN FOR NEXT  SESSION: Review/update HEP PRN. PROM/AAROM to elbow. Wrist AAROM. Given L UCL repair also avoid valgus/varus stresses. Symptom modification strategies as indicated/appropriate.  Monitor BP and check in after MD visit    Berta Minor PTA 02/05/2023 4:57 PM     Discharge addendum 05/01/2023  PHYSICAL THERAPY DISCHARGE SUMMARY  Visits from Start of Care: 7  Current functional level related to goals / functional outcomes: Unable to be assessed   Remaining deficits: Unable to be assessed   Education / Equipment: Unable to be assessed  Patient goals were unable to be assessed. Patient is being discharged due to not returning since the last visit.   Ashley Murrain PT, DPT 05/01/2023 10:29 AM

## 2023-02-17 ENCOUNTER — Ambulatory Visit: Payer: Self-pay

## 2023-02-27 ENCOUNTER — Encounter: Payer: Self-pay | Admitting: Physical Therapy

## 2023-03-06 ENCOUNTER — Encounter: Payer: Self-pay | Admitting: Physical Therapy

## 2023-03-10 ENCOUNTER — Encounter: Payer: Self-pay | Admitting: Physical Therapy
